# Patient Record
Sex: Female | Born: 1960 | Race: Black or African American | Hispanic: No | Marital: Married | Smoking: Former smoker
Health system: Southern US, Community
[De-identification: ages and names within clinical notes are randomized; demographics above are authoritative.]

## PROBLEM LIST (undated history)

## (undated) DIAGNOSIS — F4310 Post-traumatic stress disorder, unspecified: Secondary | ICD-10-CM

## (undated) DIAGNOSIS — F329 Major depressive disorder, single episode, unspecified: Secondary | ICD-10-CM

## (undated) DIAGNOSIS — IMO0001 Reserved for inherently not codable concepts without codable children: Secondary | ICD-10-CM

## (undated) DIAGNOSIS — K219 Gastro-esophageal reflux disease without esophagitis: Secondary | ICD-10-CM

## (undated) DIAGNOSIS — R7989 Other specified abnormal findings of blood chemistry: Secondary | ICD-10-CM

## (undated) DIAGNOSIS — E119 Type 2 diabetes mellitus without complications: Secondary | ICD-10-CM

## (undated) DIAGNOSIS — D649 Anemia, unspecified: Secondary | ICD-10-CM

## (undated) DIAGNOSIS — E785 Hyperlipidemia, unspecified: Secondary | ICD-10-CM

## (undated) DIAGNOSIS — F32A Depression, unspecified: Secondary | ICD-10-CM

## (undated) DIAGNOSIS — R413 Other amnesia: Secondary | ICD-10-CM

## (undated) DIAGNOSIS — R519 Headache, unspecified: Secondary | ICD-10-CM

## (undated) DIAGNOSIS — Z8719 Personal history of other diseases of the digestive system: Secondary | ICD-10-CM

## (undated) HISTORY — PX: COLONOSCOPY WITH ESOPHAGOGASTRODUODENOSCOPY (EGD): SHX5779

## (undated) HISTORY — DX: Type 2 diabetes mellitus without complications: E11.9

## (undated) HISTORY — DX: Other amnesia: R41.3

## (undated) HISTORY — DX: Gastro-esophageal reflux disease without esophagitis: K21.9

## (undated) HISTORY — DX: Anemia, unspecified: D64.9

## (undated) HISTORY — DX: Other specified abnormal findings of blood chemistry: R79.89

## (undated) HISTORY — DX: Major depressive disorder, single episode, unspecified: F32.9

## (undated) HISTORY — DX: Reserved for inherently not codable concepts without codable children: IMO0001

## (undated) HISTORY — DX: Depression, unspecified: F32.A

## (undated) HISTORY — DX: Headache, unspecified: R51.9

---

## 2004-01-04 ENCOUNTER — Other Ambulatory Visit: Admission: RE | Admit: 2004-01-04 | Discharge: 2004-01-04 | Payer: Self-pay | Admitting: Family Medicine

## 2004-02-21 ENCOUNTER — Encounter: Admission: RE | Admit: 2004-02-21 | Discharge: 2004-02-21 | Payer: Self-pay | Admitting: Family Medicine

## 2010-03-15 ENCOUNTER — Other Ambulatory Visit: Admission: RE | Admit: 2010-03-15 | Discharge: 2010-03-15 | Payer: Self-pay | Admitting: Family Medicine

## 2010-03-26 ENCOUNTER — Encounter: Admission: RE | Admit: 2010-03-26 | Discharge: 2010-03-26 | Payer: Self-pay | Admitting: Family Medicine

## 2012-01-27 ENCOUNTER — Other Ambulatory Visit: Payer: Self-pay | Admitting: Family Medicine

## 2012-01-27 DIAGNOSIS — Z1231 Encounter for screening mammogram for malignant neoplasm of breast: Secondary | ICD-10-CM

## 2012-02-12 ENCOUNTER — Other Ambulatory Visit: Payer: Self-pay | Admitting: Family Medicine

## 2012-02-12 ENCOUNTER — Ambulatory Visit
Admission: RE | Admit: 2012-02-12 | Discharge: 2012-02-12 | Disposition: A | Discharge status: Home / Self Care | Payer: BC Managed Care – PPO | Source: Ambulatory Visit | Attending: Family Medicine | Admitting: Family Medicine

## 2012-02-12 DIAGNOSIS — Z1231 Encounter for screening mammogram for malignant neoplasm of breast: Secondary | ICD-10-CM

## 2012-02-12 DIAGNOSIS — N63 Unspecified lump in unspecified breast: Secondary | ICD-10-CM

## 2012-02-21 ENCOUNTER — Other Ambulatory Visit: Payer: Self-pay | Admitting: Family Medicine

## 2012-02-21 DIAGNOSIS — Z1239 Encounter for other screening for malignant neoplasm of breast: Secondary | ICD-10-CM

## 2012-03-12 ENCOUNTER — Ambulatory Visit
Admission: RE | Admit: 2012-03-12 | Discharge: 2012-03-12 | Disposition: A | Discharge status: Home / Self Care | Payer: BC Managed Care – PPO | Source: Ambulatory Visit | Attending: Family Medicine | Admitting: Family Medicine

## 2012-03-12 DIAGNOSIS — Z1239 Encounter for other screening for malignant neoplasm of breast: Secondary | ICD-10-CM

## 2013-07-06 ENCOUNTER — Other Ambulatory Visit: Payer: Self-pay

## 2013-07-06 DIAGNOSIS — Z1231 Encounter for screening mammogram for malignant neoplasm of breast: Secondary | ICD-10-CM

## 2013-07-28 ENCOUNTER — Ambulatory Visit
Admission: RE | Admit: 2013-07-28 | Discharge: 2013-07-28 | Disposition: A | Discharge status: Home / Self Care | Payer: BC Managed Care – PPO | Source: Ambulatory Visit

## 2013-07-28 DIAGNOSIS — Z1231 Encounter for screening mammogram for malignant neoplasm of breast: Secondary | ICD-10-CM

## 2013-08-02 ENCOUNTER — Other Ambulatory Visit (HOSPITAL_COMMUNITY)
Admission: RE | Admit: 2013-08-02 | Discharge: 2013-08-02 | Disposition: A | Discharge status: Home / Self Care | Payer: BC Managed Care – PPO | Source: Ambulatory Visit | Attending: Family Medicine | Admitting: Family Medicine

## 2013-08-02 ENCOUNTER — Other Ambulatory Visit: Payer: Self-pay | Admitting: Family Medicine

## 2013-08-02 DIAGNOSIS — Z124 Encounter for screening for malignant neoplasm of cervix: Secondary | ICD-10-CM | POA: Insufficient documentation

## 2014-11-16 ENCOUNTER — Encounter: Payer: Self-pay | Admitting: *Deleted

## 2014-11-17 ENCOUNTER — Ambulatory Visit (INDEPENDENT_AMBULATORY_CARE_PROVIDER_SITE_OTHER): Payer: BC Managed Care – PPO | Admitting: Neurology

## 2014-11-17 ENCOUNTER — Encounter: Payer: Self-pay | Admitting: Neurology

## 2014-11-17 VITALS — BP 124/84 | HR 79 | Ht 66.0 in | Wt 150.3 lb

## 2014-11-17 DIAGNOSIS — R202 Paresthesia of skin: Secondary | ICD-10-CM

## 2014-11-17 DIAGNOSIS — R29898 Other symptoms and signs involving the musculoskeletal system: Secondary | ICD-10-CM

## 2014-11-17 DIAGNOSIS — R29818 Other symptoms and signs involving the nervous system: Secondary | ICD-10-CM

## 2014-11-17 LAB — VITAMIN B12: Vitamin B-12: 283 pg/mL (ref 211–911)

## 2014-11-17 NOTE — Progress Notes (Signed)
[facility] Clinic Note - Initial Visit   Date: 11/17/2014  Shannon Estes MRN: [id number] DOB: 05/02/1961   Dear Dr. Laurann Montana:  Thank you for your kind referral of Shannon Estes for consultation of right sided paresthesias. Although her history is well known to you, please allow [place] to reiterate it for the purpose of our medical record. The patient was accompanied to the clinic by self.    History of Present Illness: Shannon Estes is a 53 y.o. left-handed female with depression, anemia, and GERD presenting for evaluation of right arm and leg paresthesias.    Since the summer of 2015, she reports having intermittent numbness/tingling of entire upper extremity (proximally and distally) which usually occurs at nighttime or in the morning. It occurs about 4-5 times per week and lasts about 10-minutes.  She tries rubbing it which makes it feel better.  There is no weakness of the hands.  In early October, she was getting her hair done and when she stood up, developed acute weakness of her right leg.  She felt as if there was "no leg" and fell on the lady doing her hair, but did not have tingling sensation.  She was unable to feel her leg or move it at all.  This lasted about 10-minutes.  There was no associated face or arm weakness/numbness or loss of consciousness.  She twisted her ankle and developed leg swelling later that night.  Since then, she denies any weakness and has been doing well, but was understandable very concerned and tearful when it occurred.  Prior to that, she always felt unsteady especially when in the shower.  She also complaints of intermittent right leg pain which improves with using a heating pad.  Out-side paper records, electronic medical record, and images have been reviewed where available and summarized as:  Labs 09/29/2014:  TSH 0.95, Chol 217, DLD 81, LDL 120  Past Medical History  Diagnosis Date  . Anemia   . Reflux   .  Depression     Past Surgical History  Procedure Laterality Date  . Cesarean section      2 times  . Colonoscopy with esophagogastroduodenoscopy (egd)       Medications:  Current Outpatient Prescriptions on File Prior to Visit  Medication Sig Dispense Refill  . ferrous sulfate 325 (65 FE) MG tablet Take 325 mg by mouth daily with breakfast.    . pantoprazole (PROTONIX) 40 MG tablet Take 40 mg by mouth daily.     No current facility-administered medications on file prior to visit.    Allergies:  Allergies  Allergen Reactions  . Latex     Family History: Family History  Problem Relation Age of Onset  . Arthritis Father     Living, 23  . Colon polyps Father   . Kidney disease Mother     Living, 21  . Diabetes Mother   . Breast cancer Mother   . Other Brother     Agent orange complications, deceased 70  . Healthy Son   . Migraines Daughter     Social History: History   Social History  . Marital Status: Single    Spouse Name: N/A    Number of Children: N/A  . Years of Education: N/A   Occupational History  . Not on file.   Social History Main Topics  . Smoking status: Former Smoker    Quit date: 12/16/1981  . Smokeless tobacco: Not on file  . Alcohol  Use: 0.0 oz/week    0 Not specified per week     Comment: 2 drinks about 2-3 times per week  . Drug Use: No  . Sexual Activity: Not on file   Other Topics Concern  . Not on file   Social History Narrative   Married with 1 son and 1 daughter.  Lives in a tri-level home.     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     Education: high school and 4 years of [employer] [phone])    Review of Systems:  CONSTITUTIONAL: No fevers, chills, night sweats, or weight loss.   EYES: No visual changes or eye pain ENT: No hearing changes.  No history of nose bleeds.   RESPIRATORY: No cough, wheezing and shortness of breath.   CARDIOVASCULAR: Negative for chest pain, and palpitations.    GI: Negative for abdominal discomfort, blood in stools or black stools.  No recent change in bowel habits.   GU:  No history of incontinence.   MUSCLOSKELETAL: No history of joint pain or swelling.  No myalgias.   SKIN: Negative for lesions, rash, and itching.   HEMATOLOGY/ONCOLOGY: Negative for prolonged bleeding, bruising easily, and swollen nodes.     ENDOCRINE: Negative for cold or heat intolerance, polydipsia or goiter.   PSYCH:  No depression or anxiety symptoms.   NEURO: As Above.   Vital Signs:  BP 124/84 mmHg  Pulse 79  Ht 5' 6" (1.676 m)  Wt 150 lb 5 oz (68.181 kg)  BMI 24.27 kg/m2  SpO2 97%  General Medical Exam:   General:  Well appearing, comfortable.   Eyes/ENT: see cranial nerve examination.   Neck: No masses appreciated.  Full range of motion without tenderness.  No carotid bruits.  Lhermitte's negative. Respiratory:  Clear to auscultation, good air entry bilaterally.   Cardiac:  Regular rate and rhythm, no murmur.   Extremities:  No deformities, edema, or skin discoloration.  Skin:  No rashes or lesions.  Neurological Exam: MENTAL STATUS including orientation to time, place, person, recent and remote memory, attention span and concentration, language, and fund of knowledge is normal.  Speech is not dysarthric.  CRANIAL NERVES: II:  No visual field defects.  Unremarkable fundi.   III-IV-VI: Pupils equal round and reactive to light.  Normal conjugate, extra-ocular eye movements in all directions of gaze.  No nystagmus.  No ptosis.   V:  Normal facial sensation.     VII:  Normal facial symmetry and movements.  No pathologic facial reflexes.  VIII:  Normal hearing and vestibular function.   IX-X:  Normal palatal movement.   XI:  Normal shoulder shrug and head rotation.   XII:  Normal tongue strength and range of motion, no deviation or fasciculation.  MOTOR:  No atrophy, fasciculations or abnormal movements.  No pronator drift.  Tone is normal.    Right Upper  Extremity:    Left Upper Extremity:    Deltoid  5/5   Deltoid  5/5   Biceps  5/5   Biceps  5/5   Triceps  5/5   Triceps  5/5   Wrist extensors  5/5   Wrist extensors  5/5   Wrist flexors  5/5   Wrist flexors  5/5   Finger extensors  5/5   Finger extensors  5/5   Finger flexors  5/5   Finger flexors  5/5   Dorsal interossei  5/5   Dorsal interossei  5/5  Abductor pollicis  5/5   Abductor pollicis  5/5   Tone (Ashworth scale)  0  Tone (Ashworth scale)  0   Right Lower Extremity:    Left Lower Extremity:    Hip flexors  5/5   Hip flexors  5/5   Hip extensors  5/5   Hip extensors  5/5   Knee flexors  5/5   Knee flexors  5/5   Knee extensors  5/5   Knee extensors  5/5   Dorsiflexors  5/5   Dorsiflexors  5/5   Plantarflexors  5/5   Plantarflexors  5/5   Toe extensors  5/5   Toe extensors  5/5   Toe flexors  5/5   Toe flexors  5/5   Tone (Ashworth scale)  0  Tone (Ashworth scale)  0   MSRs:  Right                                                                 Left brachioradialis 2+  brachioradialis 2+  biceps 2+  biceps 2+  triceps 2+  triceps 2+  patellar 3+  patellar 2+  ankle jerk 2+  ankle jerk 2+  Hoffman no  Hoffman no  plantar response down  plantar response down  Tinel's negative at the wrist bilaterally.  SENSORY:  Normal and symmetric perception of light touch, pinprick, vibration, and proprioception.  Romberg's sign absent.   COORDINATION/GAIT: Normal finger-to- nose-finger and heel-to-shin.  Intact rapid alternating movements bilaterally.  Able to rise from a chair without using arms.  Gait narrow based and stable. Tandem and stressed gait intact.    IMPRESSION: Mrs. Baldridge is a 53 year-old female presenting for evaluation of episodic upper extremity paresthesias and right leg weakness (09/2014).  Her neurological exam is relatively normal, with only subtle increase in patella jerk on the right.  Motor strength and sensation is preserved.  Based on her history of  transient right leg monoplegia, vascular event such as TIA needs to be evaluated for.  I will order MRI/A brain and MRA carotids.  Demyelinating disease is another possibility, especially with the episodic nature of her symptoms.  Her upper extremity symptoms are suggestive of nerve entrapment (CTS vs ulnar neuropathy) but it is atypical for the entire upper extremity to be involved.  I'll see what her imaging shows and determine if EMG vs MRI c-spine is the next step.    PLAN/RECOMMENDATIONS:  1.  Check ESR, 2hr glucose tolerance test, vitamin B12, copper 2.  MRI brain and MRA brain and carotids 3.  If above testing is negative, will proceed with EMG of the right arm and leg 4.  For secondary risk prevention, start aspirin 8m daily 5.  Return to clinic in 268-monthor sooner as needed   The duration of this appointment visit was 45 minutes of face-to-face time with the patient.  Greater than 50% of this time was spent in counseling, explanation of diagnosis, planning of further management, and coordination of care.   Thank you for allowing me to participate in patient's care.  If I can answer any additional questions, I would be pleased to do so.    Sincerely,    Donika K. PaPosey ProntoDO

## 2014-11-17 NOTE — Progress Notes (Signed)
Note faxed.

## 2014-11-17 NOTE — Patient Instructions (Addendum)
1.  Check ESR, 2hr glucose tolerance test, vitamin B12, copper 2.  MRI brain and MRA brain and carotids 3.  Start taking aspirin 17m daily 4.  If above testing is negative and symptoms continue, will proceed with EMG of the right arm and leg 5.  Return to clinic in 241-month

## 2014-11-18 LAB — SEDIMENTATION RATE: Sed Rate: 15 mm/h (ref 0–22)

## 2014-11-19 LAB — COPPER, SERUM: Copper: 103 ug/dL (ref 70–175)

## 2014-11-21 ENCOUNTER — Telehealth: Payer: Self-pay | Admitting: Neurology

## 2014-11-21 NOTE — Telephone Encounter (Signed)
Pt returning your call [phone]

## 2014-11-23 ENCOUNTER — Other Ambulatory Visit: Payer: BC Managed Care – PPO

## 2014-11-23 DIAGNOSIS — R202 Paresthesia of skin: Secondary | ICD-10-CM

## 2014-11-23 DIAGNOSIS — R29818 Other symptoms and signs involving the nervous system: Secondary | ICD-10-CM

## 2014-11-23 DIAGNOSIS — R29898 Other symptoms and signs involving the musculoskeletal system: Secondary | ICD-10-CM

## 2014-11-23 LAB — GLUCOSE TOLERANCE, 2 HOURS
Glucose, 1 Hour GTT: 144 mg/dL
Glucose, 2 hour: 114 mg/dL
Glucose, Fasting: 101 mg/dL — ABNORMAL HIGH (ref 70–99)

## 2014-11-27 ENCOUNTER — Ambulatory Visit
Admission: RE | Admit: 2014-11-27 | Discharge: 2014-11-27 | Disposition: A | Discharge status: Home / Self Care | Payer: BC Managed Care – PPO | Source: Ambulatory Visit | Attending: Neurology | Admitting: Neurology

## 2014-11-27 DIAGNOSIS — R29898 Other symptoms and signs involving the musculoskeletal system: Secondary | ICD-10-CM

## 2014-11-27 DIAGNOSIS — R29818 Other symptoms and signs involving the nervous system: Secondary | ICD-10-CM

## 2014-11-27 DIAGNOSIS — R202 Paresthesia of skin: Secondary | ICD-10-CM

## 2014-11-27 MED ORDER — GADOBENATE DIMEGLUMINE 529 MG/ML IV SOLN
14.0000 mL | Freq: Once | INTRAVENOUS | Status: AC | PRN
  Administered 2014-11-27: 14 mL via INTRAVENOUS

## 2014-12-14 ENCOUNTER — Other Ambulatory Visit: Payer: Self-pay | Admitting: *Deleted

## 2014-12-14 DIAGNOSIS — R202 Paresthesia of skin: Secondary | ICD-10-CM

## 2015-01-12 ENCOUNTER — Telehealth: Payer: Self-pay | Admitting: Neurology

## 2015-01-12 NOTE — Telephone Encounter (Signed)
Pt canceled her 90mins EMG. Pt stated that she is feeling better since she stated eating better and doing the B-12. Pt will still come in on 01/26/15 for her f/u.

## 2015-01-16 ENCOUNTER — Encounter: Payer: BC Managed Care – PPO | Admitting: Neurology

## 2015-01-26 ENCOUNTER — Ambulatory Visit: Payer: BC Managed Care – PPO | Admitting: Neurology

## 2015-02-01 ENCOUNTER — Telehealth: Payer: Self-pay | Admitting: Neurology

## 2015-02-01 NOTE — Telephone Encounter (Signed)
Pt canceled appt to see Dr Allena KatzPatel 01-26-15

## 2016-10-16 ENCOUNTER — Other Ambulatory Visit: Payer: Self-pay | Admitting: Family Medicine

## 2016-10-16 ENCOUNTER — Other Ambulatory Visit (HOSPITAL_COMMUNITY)
Admission: RE | Admit: 2016-10-16 | Discharge: 2016-10-16 | Disposition: A | Discharge status: Home / Self Care | Payer: BLUE CROSS/BLUE SHIELD | Source: Ambulatory Visit | Attending: Family Medicine | Admitting: Family Medicine

## 2016-10-16 DIAGNOSIS — Z124 Encounter for screening for malignant neoplasm of cervix: Secondary | ICD-10-CM | POA: Insufficient documentation

## 2016-10-18 LAB — CYTOLOGY - PAP: DIAGNOSIS: NEGATIVE

## 2018-03-17 DIAGNOSIS — K219 Gastro-esophageal reflux disease without esophagitis: Secondary | ICD-10-CM | POA: Diagnosis not present

## 2018-03-17 DIAGNOSIS — R12 Heartburn: Secondary | ICD-10-CM | POA: Diagnosis not present

## 2018-06-05 ENCOUNTER — Emergency Department (HOSPITAL_COMMUNITY): Payer: Commercial Managed Care - PPO

## 2018-06-05 ENCOUNTER — Encounter (HOSPITAL_COMMUNITY): Payer: Self-pay

## 2018-06-05 ENCOUNTER — Emergency Department (HOSPITAL_COMMUNITY)
Admission: EM | Admit: 2018-06-05 | Discharge: 2018-06-05 | Disposition: A | Discharge status: Home / Self Care | Payer: Commercial Managed Care - PPO | Attending: Emergency Medicine | Admitting: Emergency Medicine

## 2018-06-05 ENCOUNTER — Other Ambulatory Visit: Payer: Self-pay

## 2018-06-05 DIAGNOSIS — Y939 Activity, unspecified: Secondary | ICD-10-CM | POA: Diagnosis not present

## 2018-06-05 DIAGNOSIS — T07XXXA Unspecified multiple injuries, initial encounter: Secondary | ICD-10-CM | POA: Diagnosis not present

## 2018-06-05 DIAGNOSIS — M25522 Pain in left elbow: Secondary | ICD-10-CM | POA: Diagnosis not present

## 2018-06-05 DIAGNOSIS — Z23 Encounter for immunization: Secondary | ICD-10-CM | POA: Insufficient documentation

## 2018-06-05 DIAGNOSIS — Z87891 Personal history of nicotine dependence: Secondary | ICD-10-CM | POA: Diagnosis not present

## 2018-06-05 DIAGNOSIS — R0902 Hypoxemia: Secondary | ICD-10-CM | POA: Diagnosis not present

## 2018-06-05 DIAGNOSIS — S3993XA Unspecified injury of pelvis, initial encounter: Secondary | ICD-10-CM | POA: Diagnosis not present

## 2018-06-05 DIAGNOSIS — Z79899 Other long term (current) drug therapy: Secondary | ICD-10-CM | POA: Diagnosis not present

## 2018-06-05 DIAGNOSIS — M542 Cervicalgia: Secondary | ICD-10-CM

## 2018-06-05 DIAGNOSIS — S199XXA Unspecified injury of neck, initial encounter: Secondary | ICD-10-CM | POA: Diagnosis not present

## 2018-06-05 DIAGNOSIS — R0789 Other chest pain: Secondary | ICD-10-CM

## 2018-06-05 DIAGNOSIS — Y92410 Unspecified street and highway as the place of occurrence of the external cause: Secondary | ICD-10-CM | POA: Diagnosis not present

## 2018-06-05 DIAGNOSIS — R1012 Left upper quadrant pain: Secondary | ICD-10-CM | POA: Insufficient documentation

## 2018-06-05 DIAGNOSIS — Y999 Unspecified external cause status: Secondary | ICD-10-CM | POA: Diagnosis not present

## 2018-06-05 DIAGNOSIS — S59902A Unspecified injury of left elbow, initial encounter: Secondary | ICD-10-CM | POA: Diagnosis not present

## 2018-06-05 DIAGNOSIS — S3991XA Unspecified injury of abdomen, initial encounter: Secondary | ICD-10-CM | POA: Diagnosis not present

## 2018-06-05 DIAGNOSIS — S299XXA Unspecified injury of thorax, initial encounter: Secondary | ICD-10-CM | POA: Diagnosis not present

## 2018-06-05 DIAGNOSIS — S0990XA Unspecified injury of head, initial encounter: Secondary | ICD-10-CM | POA: Diagnosis not present

## 2018-06-05 LAB — CBC WITH DIFFERENTIAL/PLATELET
ABS IMMATURE GRANULOCYTES: 0 10*3/uL (ref 0.0–0.1)
Basophils Absolute: 0 10*3/uL (ref 0.0–0.1)
Basophils Relative: 1 %
EOS ABS: 0.1 10*3/uL (ref 0.0–0.7)
Eosinophils Relative: 2 %
HEMATOCRIT: 35.4 % — AB (ref 36.0–46.0)
HEMOGLOBIN: 11 g/dL — AB (ref 12.0–15.0)
IMMATURE GRANULOCYTES: 0 %
LYMPHS ABS: 2 10*3/uL (ref 0.7–4.0)
LYMPHS PCT: 40 %
MCH: 29.6 pg (ref 26.0–34.0)
MCHC: 31.1 g/dL (ref 30.0–36.0)
MCV: 95.2 fL (ref 78.0–100.0)
Monocytes Absolute: 0.4 10*3/uL (ref 0.1–1.0)
Monocytes Relative: 9 %
NEUTROS ABS: 2.5 10*3/uL (ref 1.7–7.7)
NEUTROS PCT: 48 %
Platelets: 354 10*3/uL (ref 150–400)
RBC: 3.72 MIL/uL — AB (ref 3.87–5.11)
RDW: 12.1 % (ref 11.5–15.5)
WBC: 5 10*3/uL (ref 4.0–10.5)

## 2018-06-05 LAB — COMPREHENSIVE METABOLIC PANEL
ALBUMIN: 3.9 g/dL (ref 3.5–5.0)
ALK PHOS: 80 U/L (ref 38–126)
ALT: 16 U/L (ref 14–54)
AST: 22 U/L (ref 15–41)
Anion gap: 9 (ref 5–15)
BILIRUBIN TOTAL: 0.6 mg/dL (ref 0.3–1.2)
BUN: 11 mg/dL (ref 6–20)
CO2: 24 mmol/L (ref 22–32)
CREATININE: 0.91 mg/dL (ref 0.44–1.00)
Calcium: 9.3 mg/dL (ref 8.9–10.3)
Chloride: 107 mmol/L (ref 101–111)
GFR calc Af Amer: >60 mL/min (ref >60)
Glucose, Bld: 90 mg/dL (ref 65–99)
POTASSIUM: 3.5 mmol/L (ref 3.5–5.1)
Sodium: 140 mmol/L (ref 135–145)
TOTAL PROTEIN: 7.4 g/dL (ref 6.5–8.1)

## 2018-06-05 LAB — URINALYSIS, ROUTINE W REFLEX MICROSCOPIC
BACTERIA UA: NONE SEEN
BILIRUBIN URINE: NEGATIVE
Glucose, UA: NEGATIVE mg/dL
Ketones, ur: 20 mg/dL — AB
Leukocytes, UA: NEGATIVE
NITRITE: NEGATIVE
Protein, ur: NEGATIVE mg/dL
SPECIFIC GRAVITY, URINE: 1.043 — AB (ref 1.005–1.030)
pH: 6 (ref 5.0–8.0)

## 2018-06-05 LAB — LIPASE, BLOOD: LIPASE: 39 U/L (ref 11–51)

## 2018-06-05 MED ORDER — BACITRACIN ZINC 500 UNIT/GM EX OINT
TOPICAL_OINTMENT | Freq: Two times a day (BID) | CUTANEOUS | Status: DC
  Administered 2018-06-05: 1 via TOPICAL

## 2018-06-05 MED ORDER — FENTANYL CITRATE (PF) 100 MCG/2ML IJ SOLN
25.0000 ug | Freq: Once | INTRAMUSCULAR | Status: AC
  Administered 2018-06-05: 25 ug via INTRAVENOUS
  Filled 2018-06-05: qty 2

## 2018-06-05 MED ORDER — IOHEXOL 300 MG/ML  SOLN
100.0000 mL | Freq: Once | INTRAMUSCULAR | Status: AC | PRN
  Administered 2018-06-05: 100 mL via INTRAVENOUS

## 2018-06-05 MED ORDER — TETANUS-DIPHTH-ACELL PERTUSSIS 5-2.5-18.5 LF-MCG/0.5 IM SUSP
0.5000 mL | Freq: Once | INTRAMUSCULAR | Status: AC
  Administered 2018-06-05: 0.5 mL via INTRAMUSCULAR
  Filled 2018-06-05: qty 0.5

## 2018-06-05 MED ORDER — IBUPROFEN 600 MG PO TABS: 600.0000 mg | ORAL_TABLET | Freq: Four times a day (QID) | ORAL | 0 refills | Status: DC | PRN

## 2018-06-05 MED ORDER — METHOCARBAMOL 500 MG PO TABS: 500.0000 mg | ORAL_TABLET | Freq: Two times a day (BID) | ORAL | 0 refills | Status: DC

## 2018-06-05 NOTE — Discharge Instructions (Signed)
All your imaging looks good.  The pain your experiencing is likely due to muscle strain, you may take Ibuprofen and Robaxin as needed for pain management. Do not combine with any pain reliever other than tylenol. The muscle soreness should improve over the next week. Follow up with your family doctor in the next week for a recheck if you are still having symptoms. Return to ED if pain is worsening, you develop weakness or numbness of extremities, or new or concerning symptoms develop.
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
## 2018-06-05 NOTE — ED Notes (Signed)
Please call RN before sending visitors to room

## 2018-06-08 DIAGNOSIS — M542 Cervicalgia: Secondary | ICD-10-CM | POA: Diagnosis not present

## 2018-06-08 DIAGNOSIS — S61219A Laceration without foreign body of unspecified finger without damage to nail, initial encounter: Secondary | ICD-10-CM | POA: Diagnosis not present

## 2018-06-12 DIAGNOSIS — M545 Low back pain: Secondary | ICD-10-CM | POA: Diagnosis not present

## 2018-06-12 DIAGNOSIS — M542 Cervicalgia: Secondary | ICD-10-CM | POA: Diagnosis not present

## 2018-06-12 DIAGNOSIS — S060X0A Concussion without loss of consciousness, initial encounter: Secondary | ICD-10-CM | POA: Diagnosis not present

## 2018-06-15 DIAGNOSIS — M545 Low back pain: Secondary | ICD-10-CM | POA: Diagnosis not present

## 2018-06-19 DIAGNOSIS — M542 Cervicalgia: Secondary | ICD-10-CM | POA: Diagnosis not present

## 2018-07-01 DIAGNOSIS — M545 Low back pain: Secondary | ICD-10-CM | POA: Diagnosis not present

## 2018-07-01 DIAGNOSIS — M542 Cervicalgia: Secondary | ICD-10-CM | POA: Diagnosis not present

## 2018-07-01 DIAGNOSIS — R42 Dizziness and giddiness: Secondary | ICD-10-CM | POA: Diagnosis not present

## 2018-07-13 DIAGNOSIS — M542 Cervicalgia: Secondary | ICD-10-CM | POA: Diagnosis not present

## 2018-07-13 DIAGNOSIS — M545 Low back pain: Secondary | ICD-10-CM | POA: Diagnosis not present

## 2018-07-13 DIAGNOSIS — R42 Dizziness and giddiness: Secondary | ICD-10-CM | POA: Diagnosis not present

## 2018-07-17 DIAGNOSIS — M545 Low back pain: Secondary | ICD-10-CM | POA: Diagnosis not present

## 2018-07-17 DIAGNOSIS — M542 Cervicalgia: Secondary | ICD-10-CM | POA: Diagnosis not present

## 2018-07-17 DIAGNOSIS — R42 Dizziness and giddiness: Secondary | ICD-10-CM | POA: Diagnosis not present

## 2018-07-22 DIAGNOSIS — M545 Low back pain: Secondary | ICD-10-CM | POA: Diagnosis not present

## 2018-07-22 DIAGNOSIS — R42 Dizziness and giddiness: Secondary | ICD-10-CM | POA: Diagnosis not present

## 2018-07-22 DIAGNOSIS — M542 Cervicalgia: Secondary | ICD-10-CM | POA: Diagnosis not present

## 2018-07-24 DIAGNOSIS — S060X0A Concussion without loss of consciousness, initial encounter: Secondary | ICD-10-CM | POA: Diagnosis not present

## 2018-07-24 DIAGNOSIS — M545 Low back pain: Secondary | ICD-10-CM | POA: Diagnosis not present

## 2018-07-24 DIAGNOSIS — M542 Cervicalgia: Secondary | ICD-10-CM | POA: Diagnosis not present

## 2018-07-29 DIAGNOSIS — M542 Cervicalgia: Secondary | ICD-10-CM | POA: Diagnosis not present

## 2018-07-29 DIAGNOSIS — R42 Dizziness and giddiness: Secondary | ICD-10-CM | POA: Diagnosis not present

## 2018-07-29 DIAGNOSIS — M545 Low back pain: Secondary | ICD-10-CM | POA: Diagnosis not present

## 2018-08-03 DIAGNOSIS — M545 Low back pain: Secondary | ICD-10-CM | POA: Diagnosis not present

## 2018-08-03 DIAGNOSIS — M542 Cervicalgia: Secondary | ICD-10-CM | POA: Diagnosis not present

## 2018-08-03 DIAGNOSIS — R42 Dizziness and giddiness: Secondary | ICD-10-CM | POA: Diagnosis not present

## 2018-08-06 DIAGNOSIS — M545 Low back pain: Secondary | ICD-10-CM | POA: Diagnosis not present

## 2018-08-06 DIAGNOSIS — M542 Cervicalgia: Secondary | ICD-10-CM | POA: Diagnosis not present

## 2018-08-06 DIAGNOSIS — R42 Dizziness and giddiness: Secondary | ICD-10-CM | POA: Diagnosis not present

## 2018-08-10 DIAGNOSIS — M542 Cervicalgia: Secondary | ICD-10-CM | POA: Diagnosis not present

## 2018-08-10 DIAGNOSIS — M545 Low back pain: Secondary | ICD-10-CM | POA: Diagnosis not present

## 2018-08-10 DIAGNOSIS — R42 Dizziness and giddiness: Secondary | ICD-10-CM | POA: Diagnosis not present

## 2018-08-13 DIAGNOSIS — M545 Low back pain: Secondary | ICD-10-CM | POA: Diagnosis not present

## 2018-08-13 DIAGNOSIS — M542 Cervicalgia: Secondary | ICD-10-CM | POA: Diagnosis not present

## 2018-08-13 DIAGNOSIS — R42 Dizziness and giddiness: Secondary | ICD-10-CM | POA: Diagnosis not present

## 2018-08-20 DIAGNOSIS — M542 Cervicalgia: Secondary | ICD-10-CM | POA: Diagnosis not present

## 2018-08-20 DIAGNOSIS — R42 Dizziness and giddiness: Secondary | ICD-10-CM | POA: Diagnosis not present

## 2018-08-20 DIAGNOSIS — M545 Low back pain: Secondary | ICD-10-CM | POA: Diagnosis not present

## 2018-08-21 DIAGNOSIS — M542 Cervicalgia: Secondary | ICD-10-CM | POA: Diagnosis not present

## 2018-08-21 DIAGNOSIS — M545 Low back pain: Secondary | ICD-10-CM | POA: Diagnosis not present

## 2018-08-21 DIAGNOSIS — S060X0A Concussion without loss of consciousness, initial encounter: Secondary | ICD-10-CM | POA: Diagnosis not present

## 2018-08-25 DIAGNOSIS — M542 Cervicalgia: Secondary | ICD-10-CM | POA: Diagnosis not present

## 2018-08-25 DIAGNOSIS — M545 Low back pain: Secondary | ICD-10-CM | POA: Diagnosis not present

## 2018-08-25 DIAGNOSIS — R42 Dizziness and giddiness: Secondary | ICD-10-CM | POA: Diagnosis not present

## 2018-08-27 DIAGNOSIS — R42 Dizziness and giddiness: Secondary | ICD-10-CM | POA: Diagnosis not present

## 2018-08-27 DIAGNOSIS — M542 Cervicalgia: Secondary | ICD-10-CM | POA: Diagnosis not present

## 2018-08-27 DIAGNOSIS — M545 Low back pain: Secondary | ICD-10-CM | POA: Diagnosis not present

## 2018-09-03 DIAGNOSIS — M542 Cervicalgia: Secondary | ICD-10-CM | POA: Diagnosis not present

## 2018-09-03 DIAGNOSIS — M545 Low back pain: Secondary | ICD-10-CM | POA: Diagnosis not present

## 2018-09-03 DIAGNOSIS — R42 Dizziness and giddiness: Secondary | ICD-10-CM | POA: Diagnosis not present

## 2018-09-10 DIAGNOSIS — R42 Dizziness and giddiness: Secondary | ICD-10-CM | POA: Diagnosis not present

## 2018-09-10 DIAGNOSIS — M545 Low back pain: Secondary | ICD-10-CM | POA: Diagnosis not present

## 2018-09-10 DIAGNOSIS — M542 Cervicalgia: Secondary | ICD-10-CM | POA: Diagnosis not present

## 2018-10-16 DIAGNOSIS — M545 Low back pain: Secondary | ICD-10-CM | POA: Diagnosis not present

## 2018-10-16 DIAGNOSIS — S060X0A Concussion without loss of consciousness, initial encounter: Secondary | ICD-10-CM | POA: Diagnosis not present

## 2018-10-16 DIAGNOSIS — M542 Cervicalgia: Secondary | ICD-10-CM | POA: Diagnosis not present

## 2018-10-28 ENCOUNTER — Other Ambulatory Visit: Payer: Self-pay | Admitting: Family Medicine

## 2018-10-28 DIAGNOSIS — Z1231 Encounter for screening mammogram for malignant neoplasm of breast: Secondary | ICD-10-CM

## 2018-10-30 ENCOUNTER — Ambulatory Visit
Admission: RE | Admit: 2018-10-30 | Discharge: 2018-10-30 | Disposition: A | Discharge status: Home / Self Care | Payer: Commercial Managed Care - PPO | Source: Ambulatory Visit | Attending: Family Medicine | Admitting: Family Medicine

## 2018-10-30 DIAGNOSIS — Z1231 Encounter for screening mammogram for malignant neoplasm of breast: Secondary | ICD-10-CM | POA: Diagnosis not present

## 2018-11-06 DIAGNOSIS — K219 Gastro-esophageal reflux disease without esophagitis: Secondary | ICD-10-CM | POA: Diagnosis not present

## 2018-11-06 DIAGNOSIS — R7303 Prediabetes: Secondary | ICD-10-CM | POA: Diagnosis not present

## 2018-11-06 DIAGNOSIS — R413 Other amnesia: Secondary | ICD-10-CM | POA: Diagnosis not present

## 2018-11-06 DIAGNOSIS — M542 Cervicalgia: Secondary | ICD-10-CM | POA: Diagnosis not present

## 2018-11-06 DIAGNOSIS — Z0000 Encounter for general adult medical examination without abnormal findings: Secondary | ICD-10-CM | POA: Diagnosis not present

## 2018-11-06 DIAGNOSIS — D509 Iron deficiency anemia, unspecified: Secondary | ICD-10-CM | POA: Diagnosis not present

## 2018-11-06 DIAGNOSIS — Z136 Encounter for screening for cardiovascular disorders: Secondary | ICD-10-CM | POA: Diagnosis not present

## 2018-11-06 DIAGNOSIS — Z1159 Encounter for screening for other viral diseases: Secondary | ICD-10-CM | POA: Diagnosis not present

## 2019-03-08 DIAGNOSIS — Z1211 Encounter for screening for malignant neoplasm of colon: Secondary | ICD-10-CM | POA: Diagnosis not present

## 2019-03-08 DIAGNOSIS — K219 Gastro-esophageal reflux disease without esophagitis: Secondary | ICD-10-CM | POA: Diagnosis not present

## 2019-04-16 DIAGNOSIS — D500 Iron deficiency anemia secondary to blood loss (chronic): Secondary | ICD-10-CM | POA: Diagnosis not present

## 2019-04-16 DIAGNOSIS — E559 Vitamin D deficiency, unspecified: Secondary | ICD-10-CM | POA: Diagnosis not present

## 2019-11-16 ENCOUNTER — Other Ambulatory Visit (HOSPITAL_COMMUNITY)
Admission: RE | Admit: 2019-11-16 | Discharge: 2019-11-16 | Disposition: A | Discharge status: Home / Self Care | Payer: BC Managed Care – PPO | Source: Ambulatory Visit | Attending: Family Medicine | Admitting: Family Medicine

## 2019-11-16 ENCOUNTER — Other Ambulatory Visit: Payer: Self-pay | Admitting: Family Medicine

## 2019-11-16 DIAGNOSIS — Z113 Encounter for screening for infections with a predominantly sexual mode of transmission: Secondary | ICD-10-CM | POA: Diagnosis not present

## 2019-11-16 DIAGNOSIS — R350 Frequency of micturition: Secondary | ICD-10-CM | POA: Diagnosis not present

## 2019-11-16 DIAGNOSIS — D500 Iron deficiency anemia secondary to blood loss (chronic): Secondary | ICD-10-CM | POA: Diagnosis not present

## 2019-11-16 DIAGNOSIS — Z124 Encounter for screening for malignant neoplasm of cervix: Secondary | ICD-10-CM | POA: Diagnosis present

## 2019-11-16 DIAGNOSIS — Z0000 Encounter for general adult medical examination without abnormal findings: Secondary | ICD-10-CM | POA: Diagnosis not present

## 2019-11-16 DIAGNOSIS — Z13220 Encounter for screening for lipoid disorders: Secondary | ICD-10-CM | POA: Diagnosis not present

## 2019-11-16 DIAGNOSIS — E559 Vitamin D deficiency, unspecified: Secondary | ICD-10-CM | POA: Diagnosis not present

## 2019-11-17 DIAGNOSIS — L811 Chloasma: Secondary | ICD-10-CM | POA: Diagnosis not present

## 2019-11-18 LAB — CYTOLOGY - PAP
Chlamydia: NEGATIVE
Comment: NEGATIVE
Comment: NORMAL
Diagnosis: NEGATIVE
Neisseria Gonorrhea: NEGATIVE

## 2020-04-26 ENCOUNTER — Other Ambulatory Visit: Payer: Self-pay

## 2020-04-26 ENCOUNTER — Encounter: Payer: Self-pay | Admitting: Physical Therapy

## 2020-04-26 ENCOUNTER — Ambulatory Visit: Payer: 59 | Attending: Family Medicine | Admitting: Physical Therapy

## 2020-04-26 DIAGNOSIS — R519 Headache, unspecified: Secondary | ICD-10-CM | POA: Diagnosis present

## 2020-04-26 DIAGNOSIS — R293 Abnormal posture: Secondary | ICD-10-CM | POA: Diagnosis present

## 2020-04-26 DIAGNOSIS — G8929 Other chronic pain: Secondary | ICD-10-CM | POA: Insufficient documentation

## 2020-04-26 DIAGNOSIS — M542 Cervicalgia: Secondary | ICD-10-CM | POA: Insufficient documentation
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
## 2020-05-17 ENCOUNTER — Encounter: Payer: 59 | Admitting: Physical Therapy

## 2020-05-17 ENCOUNTER — Ambulatory Visit: Payer: 59 | Admitting: Physical Therapy

## 2020-05-17 ENCOUNTER — Other Ambulatory Visit: Payer: Self-pay

## 2020-05-19 ENCOUNTER — Ambulatory Visit: Payer: 59 | Admitting: Physical Therapy

## 2020-05-22 ENCOUNTER — Encounter: Payer: 59 | Admitting: Physical Therapy

## 2020-05-24 ENCOUNTER — Encounter: Payer: 59 | Admitting: Physical Therapy

## 2020-05-29 ENCOUNTER — Other Ambulatory Visit: Payer: Self-pay | Admitting: Family Medicine

## 2020-05-29 ENCOUNTER — Encounter: Payer: 59 | Admitting: Physical Therapy

## 2020-05-29 DIAGNOSIS — Z1231 Encounter for screening mammogram for malignant neoplasm of breast: Secondary | ICD-10-CM

## 2020-05-30 ENCOUNTER — Other Ambulatory Visit: Payer: Self-pay

## 2020-05-30 ENCOUNTER — Ambulatory Visit
Admission: RE | Admit: 2020-05-30 | Discharge: 2020-05-30 | Disposition: A | Discharge status: Home / Self Care | Payer: 59 | Source: Ambulatory Visit | Attending: Family Medicine | Admitting: Family Medicine

## 2020-05-30 DIAGNOSIS — Z1231 Encounter for screening mammogram for malignant neoplasm of breast: Secondary | ICD-10-CM

## 2020-05-31 ENCOUNTER — Encounter: Payer: 59 | Admitting: Physical Therapy

## 2020-06-05 ENCOUNTER — Encounter: Payer: 59 | Admitting: Physical Therapy

## 2020-06-07 ENCOUNTER — Encounter: Payer: 59 | Admitting: Physical Therapy

## 2020-07-10 IMAGING — MG DIGITAL SCREENING BILAT W/ TOMO W/ CAD
8 series · 8 of 24 positions shown · non-contrast
Comparison: Previous exam(s).

CLINICAL DATA: Screening.

EXAM:
DIGITAL SCREENING BILATERAL MAMMOGRAM WITH TOMO AND CAD

[R CC synth-2D]
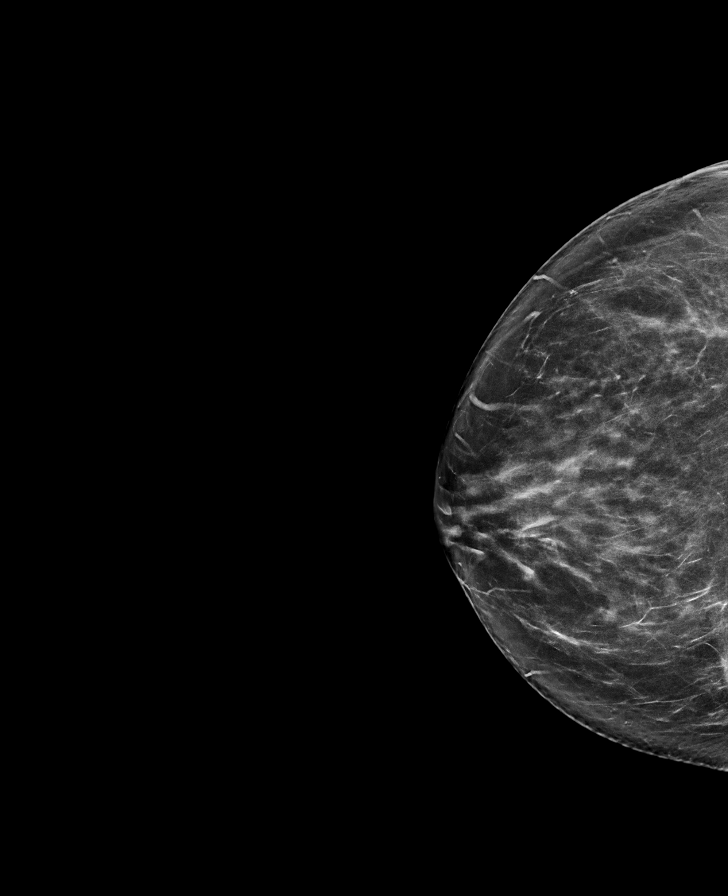

[R MLO synth-2D]
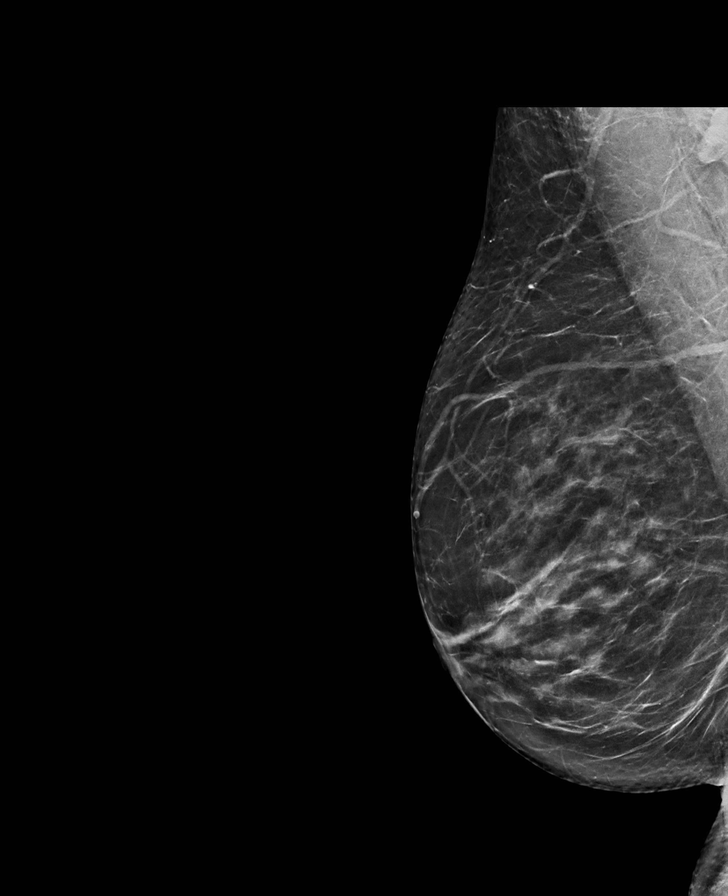

[L CC synth-2D]
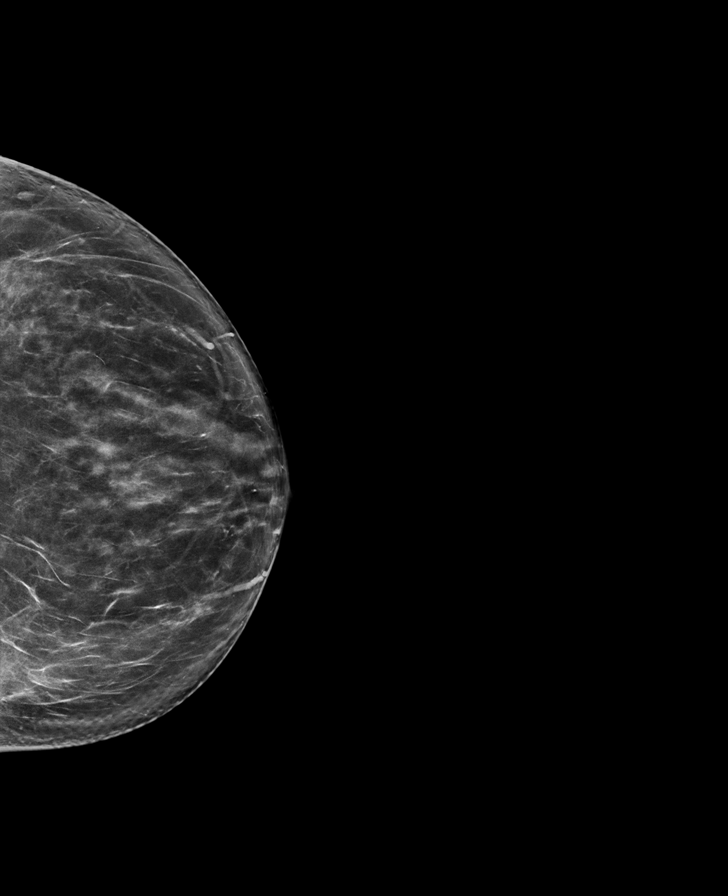

[L MLO synth-2D]
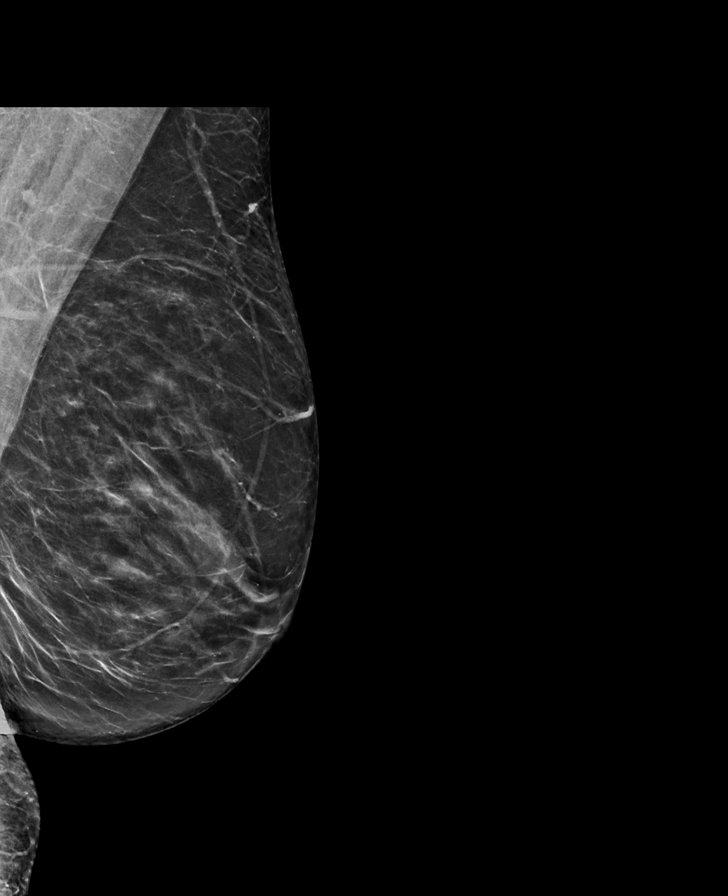

[L MLO tomo · tomo slice 37/74.0]
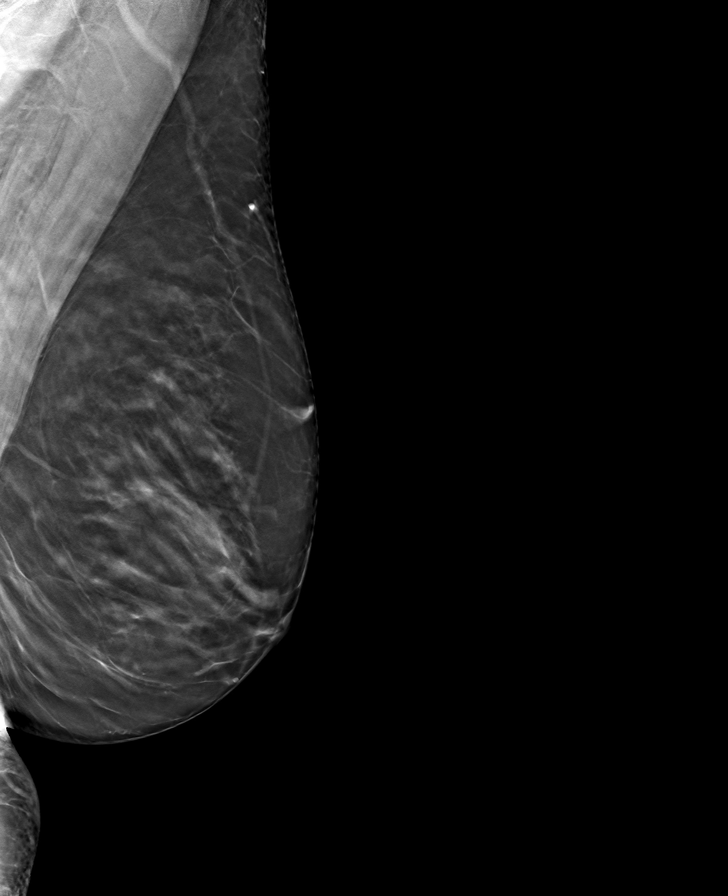

[L CC tomo · tomo slice 39/76.0]
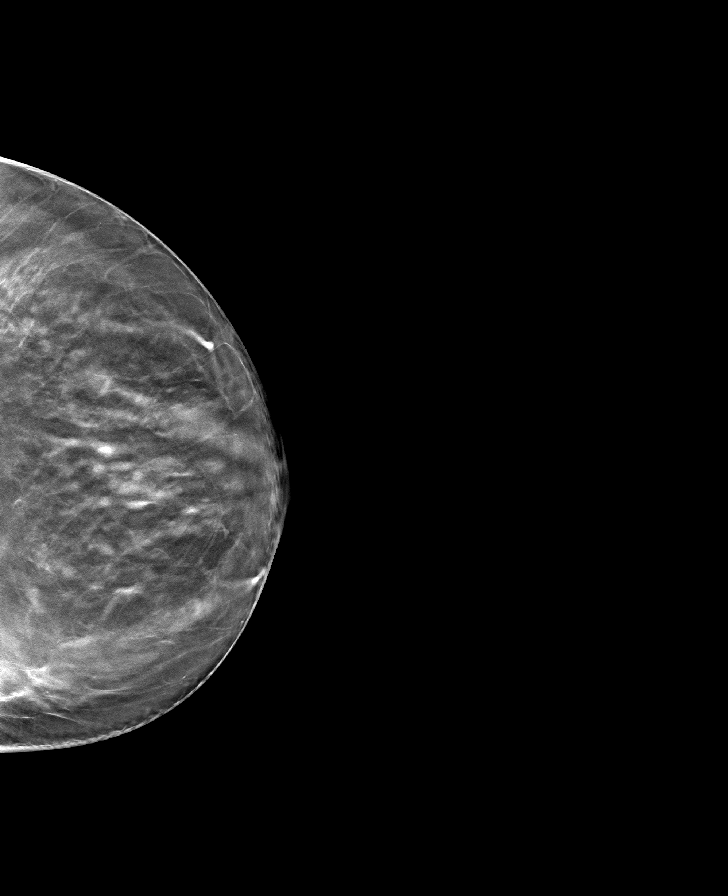

[R MLO tomo · tomo slice 37/73.0]
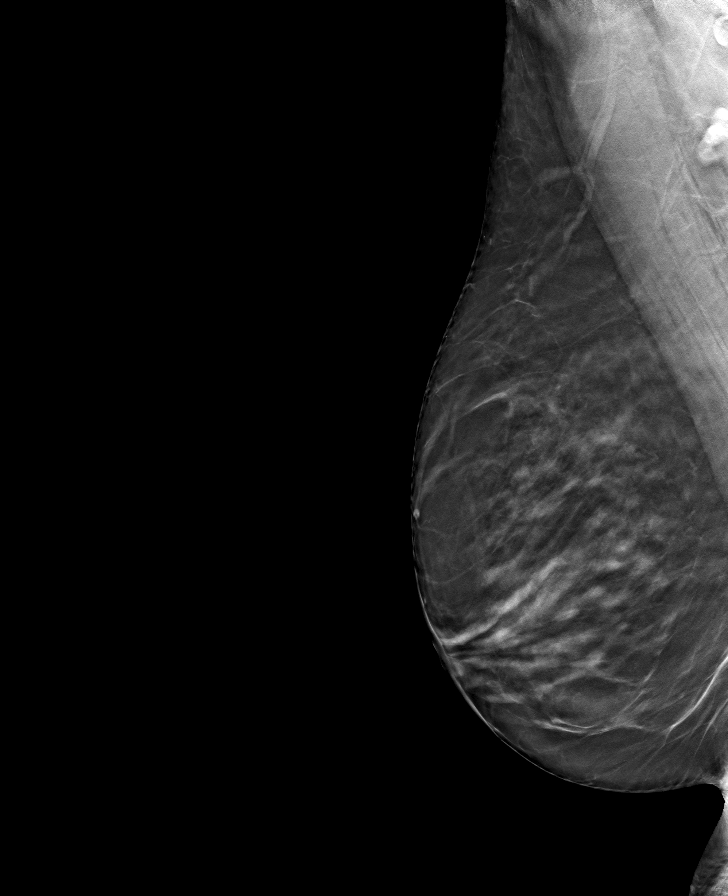

[R CC tomo · tomo slice 38/75.0]
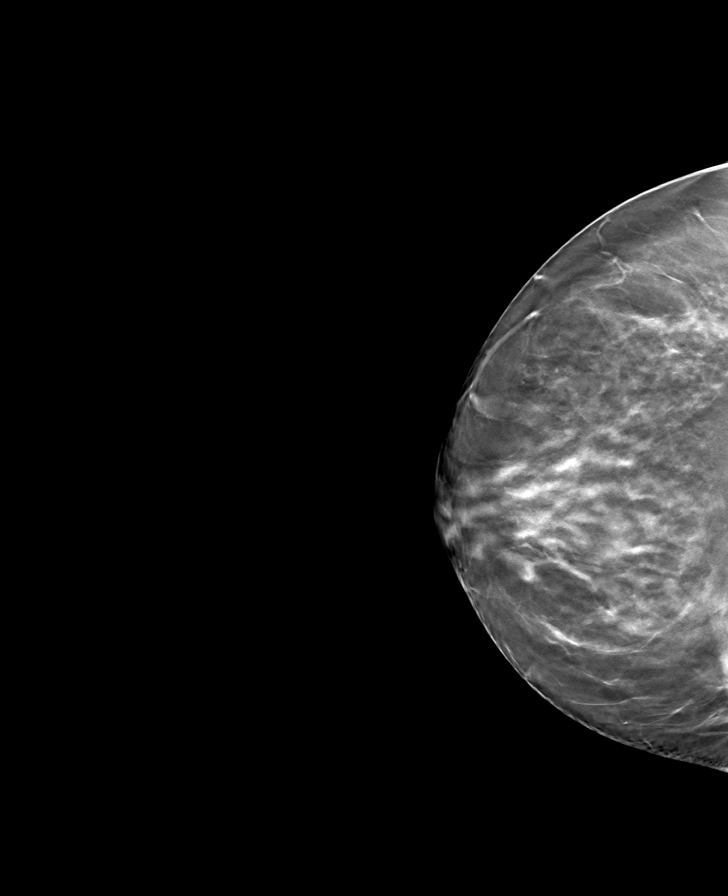

[8 of 24 positions shown; findings below may reference images not displayed]

ACR Breast Density Category b: There are scattered areas of
fibroglandular density.
FINDINGS: There are no findings suspicious for malignancy. Images were
processed with CAD.
IMPRESSION: No mammographic evidence of malignancy. A result letter of this
screening mammogram will be mailed directly to the patient.

RECOMMENDATION:
Screening mammogram in one year. (Code:[id number])

BI-RADS CATEGORY  1: Negative.

## 2020-09-14 ENCOUNTER — Encounter: Payer: Self-pay | Admitting: *Deleted

## 2020-09-15 ENCOUNTER — Ambulatory Visit (INDEPENDENT_AMBULATORY_CARE_PROVIDER_SITE_OTHER): Payer: 59 | Admitting: Neurology

## 2020-09-15 ENCOUNTER — Encounter: Payer: Self-pay | Admitting: Neurology

## 2020-09-15 ENCOUNTER — Other Ambulatory Visit: Payer: Self-pay

## 2020-09-15 VITALS — BP 137/83 | HR 98 | Ht 66.0 in | Wt 163.0 lb

## 2020-09-15 DIAGNOSIS — R51 Headache with orthostatic component, not elsewhere classified: Secondary | ICD-10-CM | POA: Diagnosis not present

## 2020-09-15 DIAGNOSIS — H539 Unspecified visual disturbance: Secondary | ICD-10-CM | POA: Diagnosis not present

## 2020-09-15 DIAGNOSIS — H5460 Unqualified visual loss, one eye, unspecified: Secondary | ICD-10-CM | POA: Diagnosis not present

## 2020-09-15 DIAGNOSIS — R519 Headache, unspecified: Secondary | ICD-10-CM | POA: Diagnosis not present

## 2020-09-15 MED ORDER — PREDNISONE 20 MG PO TABS: 60.0000 mg | ORAL_TABLET | Freq: Every day | ORAL | 0 refills | Status: DC
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
## 2020-09-15 NOTE — Patient Instructions (Signed)
MRI brain Bloodwork Start Prednisone   Temporal Arteritis  Temporal arteritis is a condition that causes arteries to become inflamed. It usually affects arteries in your head and face, but arteries in any part of the body can become inflamed. The condition is also called giant cell arteritis.  Temporal arteritis can cause serious problems such as blindness. Early treatment can help prevent these problems. What are the causes? The cause of this condition is not known. What increases the risk? The following factors may make you more likely to develop this condition:  Being older than 50.  Being a woman.  Being Caucasian.  Being of [place], [place], [place], [place], or [place] ancestry.  Having a family history of the condition.  Having a certain condition that causes muscle pain and stiffness (polymyalgia rheumatica, PMR). What are the signs or symptoms? Some people with temporal arteritis have just one symptom, while others have several symptoms. Most symptoms are related to the head and face. These may include:  Headache.  Hard or swollen temples. This is common. Your temples are the areas on either side of your forehead. If your temples are swollen, it may hurt to touch them.  Pain when combing your hair or when laying your head down.  Pain in the jaw when chewing.  Pain in the throat or tongue.  Problems with your vision, such as sudden loss of vision in one eye, or seeing double. Other symptoms may include:  Fever.  Tiredness (fatigue).  A dry cough.  Pain in the hips and shoulders.  Pain in the arms during exercise.  Depression.  Weight loss. How is this diagnosed? This condition may be diagnosed based on:  Your symptoms.  Your medical history.  A physical exam.  Tests, including: ? Blood tests. ? A test in which a tissue sample is removed from an artery so it can be examined (biopsy). ? Imaging tests, such as an ultrasound or MRI. How is  this treated? This condition may be treated with:  A type of medicine to reduce inflammation (corticosteroid).  Medicines to weaken your immune system (immunosuppressants).  Other medicines to treat vision problems. You will need to see your health care provider while you are being treated. The medicines used to treat this condition can increase your risk of problems such as bone loss and diabetes. During follow-up visits, your health care provider will check for problems by:  Doing blood tests and bone density tests.  Checking your blood pressure and blood sugar. Follow these instructions at home: Medicines  Take over-the-counter and prescription medicines only as told by your health care provider.  Take any vitamins or supplements recommended by your health care provider. These may include vitamin D and calcium, which help keep your bones from becoming weak. Eating and drinking   Eat a heart-healthy diet. This may include: ? Eating high-fiber foods, such as fresh fruits and vegetables, whole grains, and beans. ? Eating heart-healthy fats (omega-3 fats), such as fish, flaxseed, and flaxseed oil. ? Limiting foods that are high in saturated fat and cholesterol, such as processed and fried foods, fatty meat, and full-fat dairy. ? Limiting how much salt (sodium) you eat.  Include calcium and vitamin D in your diet. Good sources of calcium and vitamin D include: ? Low-fat dairy products such as milk, yogurt, and cheese. ? Certain fish, such as fresh or canned salmon, tuna, and sardines. ? Products that have calcium and vitamin D added to them (fortified products), such as fortified  cereals or juice. General instructions  Exercise. Talk with your health care provider about what exercises are okay for you. Exercises that increase your heart rate (aerobic exercise), such as walking, are often recommended. Aerobic exercise helps control your blood pressure and prevent bone loss.  Stay up  to date on all vaccines as directed by your health care provider.  Keep all follow-up visits as told by your health care provider. This is important. Contact a health care provider if:  Your symptoms get worse.  You develop signs of infection, such as fever, swelling, redness, warmth, and tenderness. Get help right away if:  You lose your vision.  Your pain does not go away, even after you take medicine.  You have chest pain.  You have trouble breathing.  One side of your face or body suddenly becomes weak or numb. These symptoms may represent a serious problem that is an emergency. Do not wait to see if the symptoms will go away. Get medical help right away. Call your local emergency services (911 in the [place] Do not drive yourself to the hospital. Summary  Temporal arteritis is a condition that causes arteries to become inflamed. It usually affects arteries in your head and face.  This condition can cause serious problems, such as blindness. Treatment can help prevent these problems.  Symptoms may include hard or tender temples, pain in your jaw when chewing, problems with your vision, or pain in your hips and shoulders.  Take over-the-counter and prescription medicines as told by your health care provider. This information is not intended to replace advice given to you by your health care provider. Make sure you discuss any questions you have with your health care provider. Document Revised: 01/15/2018 Document Reviewed: 01/13/2018 Elsevier Patient Education  2020 Elsevier Inc.   Prednisone tablets What is this medicine? PREDNISONE (PRED ni sone) is a corticosteroid. It is commonly used to treat inflammation of the skin, joints, lungs, and other organs. Common conditions treated include asthma, allergies, and arthritis. It is also used for other conditions, such as blood disorders and diseases of the adrenal glands. This medicine may be used for other purposes; ask your  health care provider or pharmacist if you have questions. COMMON BRAND NAME(S): Deltasone, Predone, Sterapred, Sterapred DS What should I tell my health care provider before I take this medicine? They need to know if you have any of these conditions:  Cushing's syndrome  diabetes  glaucoma  heart disease  high blood pressure  infection (especially a virus infection such as chickenpox, cold sores, or herpes)  kidney disease  liver disease  mental illness  myasthenia gravis  osteoporosis  seizures  stomach or intestine problems  thyroid disease  an unusual or allergic reaction to lactose, prednisone, other medicines, foods, dyes, or preservatives  pregnant or trying to get pregnant  breast-feeding How should I use this medicine? Take this medicine by mouth with a glass of water. Follow the directions on the prescription label. Take this medicine with food. If you are taking this medicine once a day, take it in the morning. Do not take more medicine than you are told to take. Do not suddenly stop taking your medicine because you may develop a severe reaction. Your doctor will tell you how much medicine to take. If your doctor wants you to stop the medicine, the dose may be slowly lowered over time to avoid any side effects. Talk to your pediatrician regarding the use of this medicine in children.  Special care may be needed. Overdosage: If you think you have taken too much of this medicine contact a poison control center or emergency room at once. NOTE: This medicine is only for you. Do not share this medicine with others. What if I miss a dose? If you miss a dose, take it as soon as you can. If it is almost time for your next dose, talk to your doctor or health care professional. You may need to miss a dose or take an extra dose. Do not take double or extra doses without advice. What may interact with this medicine? Do not take this medicine with any of the following  medications:  metyrapone  mifepristone This medicine may also interact with the following medications:  aminoglutethimide  amphotericin B  aspirin and aspirin-like medicines  barbiturates  certain medicines for diabetes, like glipizide or glyburide  cholestyramine  cholinesterase inhibitors  cyclosporine  digoxin  diuretics  ephedrine  female hormones, like estrogens and birth control pills  isoniazid  ketoconazole  NSAIDS, medicines for pain and inflammation, like ibuprofen or naproxen  phenytoin  rifampin  toxoids  vaccines  warfarin This list may not describe all possible interactions. Give your health care provider a list of all the medicines, herbs, non-prescription drugs, or dietary supplements you use. Also tell them if you smoke, drink alcohol, or use illegal drugs. Some items may interact with your medicine. What should I watch for while using this medicine? Visit your doctor or health care professional for regular checks on your progress. If you are taking this medicine over a prolonged period, carry an identification card with your name and address, the type and dose of your medicine, and your doctor's name and address. This medicine may increase your risk of getting an infection. Tell your doctor or health care professional if you are around anyone with measles or chickenpox, or if you develop sores or blisters that do not heal properly. If you are going to have surgery, tell your doctor or health care professional that you have taken this medicine within the last twelve months. Ask your doctor or health care professional about your diet. You may need to lower the amount of salt you eat. This medicine may increase blood sugar. Ask your healthcare provider if changes in diet or medicines are needed if you have diabetes. What side effects may I notice from receiving this medicine? Side effects that you should report to your doctor or health care  professional as soon as possible:  allergic reactions like skin rash, itching or hives, swelling of the face, lips, or tongue  changes in emotions or moods  changes in vision  depressed mood  eye pain  fever or chills, cough, sore throat, pain or difficulty passing urine  signs and symptoms of high blood sugar such as being more thirsty or hungry or having to urinate more than normal. You may also feel very tired or have blurry vision.  swelling of ankles, feet Side effects that usually do not require medical attention (report to your doctor or health care professional if they continue or are bothersome):  confusion, excitement, restlessness  headache  nausea, vomiting  skin problems, acne, thin and shiny skin  trouble sleeping  weight gain This list may not describe all possible side effects. Call your doctor for medical advice about side effects. You may report side effects to FDA at 1-800-FDA-1088. Where should I keep my medicine? Keep out of the reach of children. Store at room  temperature between 15 and 30 degrees C (59 and 86 degrees F). Protect from light. Keep container tightly closed. Throw away any unused medicine after the expiration date. NOTE: This sheet is a summary. It may not cover all possible information. If you have questions about this medicine, talk to your doctor, pharmacist, or health care provider.  2020 Elsevier/Gold Standard (2018-09-01 10:54:22)

## 2020-09-16 LAB — CBC
Hematocrit: 32.7 % — ABNORMAL LOW (ref 34.0–46.6)
Hemoglobin: 10.6 g/dL — ABNORMAL LOW (ref 11.1–15.9)
MCH: 30.5 pg (ref 26.6–33.0)
MCHC: 32.4 g/dL (ref 31.5–35.7)
MCV: 94 fL (ref 79–97)
Platelets: 374 10*3/uL (ref 150–450)
RBC: 3.47 x10E6/uL — ABNORMAL LOW (ref 3.77–5.28)
RDW: 11.9 % (ref 11.7–15.4)
WBC: 4 10*3/uL (ref 3.4–10.8)

## 2020-09-16 LAB — C-REACTIVE PROTEIN: CRP: 3 mg/L (ref 0–10)

## 2020-09-16 LAB — COMPREHENSIVE METABOLIC PANEL
ALT: 13 IU/L (ref 0–32)
AST: 17 IU/L (ref 0–40)
Albumin/Globulin Ratio: 1.3 (ref 1.2–2.2)
Albumin: 4.4 g/dL (ref 3.8–4.9)
Alkaline Phosphatase: 105 IU/L (ref 44–121)
BUN/Creatinine Ratio: 15 (ref 9–23)
BUN: 13 mg/dL (ref 6–24)
Bilirubin Total: 0.2 mg/dL (ref 0.0–1.2)
CO2: 24 mmol/L (ref 20–29)
Calcium: 9.5 mg/dL (ref 8.7–10.2)
Chloride: 100 mmol/L (ref 96–106)
Creatinine, Ser: 0.89 mg/dL (ref 0.57–1.00)
GFR calc Af Amer: 83 mL/min/{1.73_m2} (ref >59)
GFR calc non Af Amer: 72 mL/min/{1.73_m2} (ref >59)
Globulin, Total: 3.4 g/dL (ref 1.5–4.5)
Glucose: 87 mg/dL (ref 65–99)
Potassium: 4.8 mmol/L (ref 3.5–5.2)
Sodium: 138 mmol/L (ref 134–144)
Total Protein: 7.8 g/dL (ref 6.0–8.5)

## 2020-09-16 LAB — SEDIMENTATION RATE: Sed Rate: 57 mm/h — ABNORMAL HIGH (ref 0–40)

## 2020-09-18 ENCOUNTER — Telehealth: Payer: Self-pay | Admitting: *Deleted

## 2020-09-18 ENCOUNTER — Telehealth: Payer: Self-pay | Admitting: Neurology

## 2020-09-18 ENCOUNTER — Other Ambulatory Visit: Payer: Self-pay | Admitting: Neurology

## 2020-09-18 DIAGNOSIS — M316 Other giant cell arteritis: Secondary | ICD-10-CM

## 2020-09-18 NOTE — Telephone Encounter (Signed)
Called pt and asked for a call back as soon as possible. Left office number in message.

## 2020-09-18 NOTE — Telephone Encounter (Signed)
She can continue that is totally fine.  Shannon Estes, does [facility] perform the temporal artery biopsy as well? If so, can they see her sooner than [facility]? Would you mind finding out? Thanks!

## 2020-09-18 NOTE — Telephone Encounter (Signed)
[facility] auth: [id number] (exp. 09/18/20 to 12/17/20) order sent to GI. They will reach out to the patient to schedule.

## 2020-09-18 NOTE — Progress Notes (Signed)
surgery

## 2020-09-18 NOTE — Telephone Encounter (Signed)
Pt returned call. Please call back when available. 

## 2020-09-18 NOTE — Telephone Encounter (Signed)
I called the pt back and discussed the message from Dr Lucia Gaskins. Pt is willing to move forward with temporal artery biopsy. She is aware the first appt will be a consultation. Pt's MRI is scheduled for 09/29/20. Pt understands to continue the steroids. Pt also asked if it's ok to continue taking the Gabapentin and Flexeril that she was previously prescribed. Pt's questions were answered. She verbalized appreciation for the call and is aware [facility] will call her to schedule.

## 2020-09-18 NOTE — Telephone Encounter (Signed)
[facility] pending  

## 2020-09-18 NOTE — Telephone Encounter (Signed)
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 I think she should have a temporal artery biopsy which is the only way to tell for sure. In the meantime staying on the steroid is indicated. Let me know if she would like to go forward, she will meet with surgery first prior to the biospy for an informational meeting

## 2020-09-19 NOTE — Telephone Encounter (Signed)
I called the pt and LMOM (ok per DPR) advising Dr Lucia Gaskins is completely fine with pt continuing the Gabapentin and Flexeril. Also advised her we will be working on the referral and their office will be in touch to schedule. Left our office number in message for call back if she has any questions.

## 2020-09-19 NOTE — Telephone Encounter (Signed)
[facility] is seeing the patient 09/22/2020 Dr. Arnoldo Morale . Patient is Aware .

## 2020-09-19 NOTE — Telephone Encounter (Signed)
Wonderful. Thank you.

## 2020-09-25 ENCOUNTER — Other Ambulatory Visit: Payer: Self-pay | Admitting: General Surgery

## 2020-09-29 ENCOUNTER — Other Ambulatory Visit: Payer: Self-pay

## 2020-09-29 ENCOUNTER — Ambulatory Visit
Admission: RE | Admit: 2020-09-29 | Discharge: 2020-09-29 | Disposition: A | Discharge status: Home / Self Care | Payer: 59 | Source: Ambulatory Visit | Attending: Neurology | Admitting: Neurology

## 2020-09-29 DIAGNOSIS — H539 Unspecified visual disturbance: Secondary | ICD-10-CM

## 2020-09-29 DIAGNOSIS — R51 Headache with orthostatic component, not elsewhere classified: Secondary | ICD-10-CM

## 2020-09-29 DIAGNOSIS — H5460 Unqualified visual loss, one eye, unspecified: Secondary | ICD-10-CM

## 2020-09-29 DIAGNOSIS — R519 Headache, unspecified: Secondary | ICD-10-CM

## 2020-09-29 MED ORDER — GADOBENATE DIMEGLUMINE 529 MG/ML IV SOLN
20.0000 mL | Freq: Once | INTRAVENOUS | Status: AC | PRN
  Administered 2020-09-29: 20 mL via INTRAVENOUS

## 2020-10-02 ENCOUNTER — Telehealth: Payer: Self-pay | Admitting: *Deleted

## 2020-10-02 NOTE — Telephone Encounter (Signed)
-----   Message from Anson Fret, MD sent at 10/01/2020  9:45 PM EDT ----- MRI of the brain unremarkable, white matter changes consistent with age. Can you call and discuss results and also see if she had the biopsy and if she is still on steroids? Thank you.
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
## 2020-10-04 ENCOUNTER — Encounter (HOSPITAL_BASED_OUTPATIENT_CLINIC_OR_DEPARTMENT_OTHER): Payer: Self-pay | Admitting: General Surgery

## 2020-10-04 ENCOUNTER — Other Ambulatory Visit: Payer: Self-pay

## 2020-10-06 NOTE — Progress Notes (Signed)
      Enhanced Recovery after Surgery for Orthopedics Enhanced Recovery after Surgery is a protocol used to improve the stress on your body and your recovery after surgery.  Patient Instructions  . The night before surgery:  o No food after midnight. ONLY clear liquids after midnight  . The day of surgery (if you do NOT have diabetes):  o Drink ONE (1) Pre-Surgery Clear Ensure as directed.   o This drink was given to you during your hospital  pre-op appointment visit. o The pre-op nurse will instruct you on the time to drink the  Pre-Surgery Ensure depending on your surgery time. o Finish the drink at the designated time by the pre-op nurse.  o Nothing else to drink after completing the  Pre-Surgery Clear Ensure.  . The day of surgery (if you have diabetes): o Drink ONE (1) Gatorade 2 (G2) as directed. o This drink was given to you during your hospital  pre-op appointment visit.  o The pre-op nurse will instruct you on the time to drink the   Gatorade 2 (G2) depending on your surgery time. o Color of the Gatorade may vary. Red is not allowed. o Nothing else to drink after completing the  Gatorade 2 (G2).         If you have questions, please contact your surgeon's office.  Surgical soap given, patient verbalized understanding.

## 2020-10-09 ENCOUNTER — Other Ambulatory Visit (HOSPITAL_COMMUNITY)
Admission: RE | Admit: 2020-10-09 | Discharge: 2020-10-09 | Disposition: A | Discharge status: Home / Self Care | Payer: 59 | Source: Ambulatory Visit | Attending: General Surgery | Admitting: General Surgery

## 2020-10-09 DIAGNOSIS — Z01812 Encounter for preprocedural laboratory examination: Secondary | ICD-10-CM | POA: Diagnosis present

## 2020-10-09 DIAGNOSIS — Z20822 Contact with and (suspected) exposure to covid-19: Secondary | ICD-10-CM | POA: Diagnosis not present

## 2020-10-09 LAB — SARS CORONAVIRUS 2 (TAT 6-24 HRS): SARS Coronavirus 2: NEGATIVE
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
## 2020-10-12 ENCOUNTER — Ambulatory Visit (HOSPITAL_BASED_OUTPATIENT_CLINIC_OR_DEPARTMENT_OTHER): Payer: 59 | Admitting: Certified Registered"

## 2020-10-12 ENCOUNTER — Other Ambulatory Visit: Payer: Self-pay

## 2020-10-12 ENCOUNTER — Encounter (HOSPITAL_BASED_OUTPATIENT_CLINIC_OR_DEPARTMENT_OTHER): Payer: Self-pay | Admitting: General Surgery

## 2020-10-12 ENCOUNTER — Encounter (HOSPITAL_BASED_OUTPATIENT_CLINIC_OR_DEPARTMENT_OTHER)
Admission: RE | Disposition: A | Discharge status: Home / Self Care | Payer: Self-pay | Source: Home / Self Care | Attending: General Surgery

## 2020-10-12 ENCOUNTER — Ambulatory Visit (HOSPITAL_BASED_OUTPATIENT_CLINIC_OR_DEPARTMENT_OTHER)
Admission: RE | Admit: 2020-10-12 | Discharge: 2020-10-12 | Disposition: A | Discharge status: Home / Self Care | Payer: 59 | Attending: General Surgery | Admitting: General Surgery

## 2020-10-12 DIAGNOSIS — R519 Headache, unspecified: Secondary | ICD-10-CM | POA: Diagnosis present

## 2020-10-12 HISTORY — DX: Gastro-esophageal reflux disease without esophagitis: K21.9

## 2020-10-12 HISTORY — PX: ARTERY BIOPSY: SHX891

## 2020-10-12 SURGERY — BIOPSY TEMPORAL ARTERY
Anesthesia: General | Site: Head | Laterality: Left

## 2020-10-12 MED ORDER — MIDAZOLAM HCL 2 MG/2ML IJ SOLN
INTRAMUSCULAR | Status: AC
  Filled 2020-10-12: qty 2

## 2020-10-12 MED ORDER — OXYCODONE HCL 5 MG PO TABS: 5.0000 mg | ORAL_TABLET | Freq: Once | ORAL | Status: DC | PRN

## 2020-10-12 MED ORDER — FENTANYL CITRATE (PF) 100 MCG/2ML IJ SOLN
INTRAMUSCULAR | Status: DC | PRN
  Administered 2020-10-12: 25 ug via INTRAVENOUS
  Administered 2020-10-12: 100 ug via INTRAVENOUS

## 2020-10-12 MED ORDER — ONDANSETRON HCL 4 MG/2ML IJ SOLN
INTRAMUSCULAR | Status: DC | PRN
  Administered 2020-10-12: 4 mg via INTRAVENOUS

## 2020-10-12 MED ORDER — PHENYLEPHRINE 40 MCG/ML (10ML) SYRINGE FOR IV PUSH (FOR BLOOD PRESSURE SUPPORT)
PREFILLED_SYRINGE | INTRAVENOUS | Status: DC | PRN
  Administered 2020-10-12: 80 ug via INTRAVENOUS

## 2020-10-12 MED ORDER — FENTANYL CITRATE (PF) 100 MCG/2ML IJ SOLN: 25.0000 ug | INTRAMUSCULAR | Status: DC | PRN

## 2020-10-12 MED ORDER — MIDAZOLAM HCL 5 MG/5ML IJ SOLN
INTRAMUSCULAR | Status: DC | PRN
  Administered 2020-10-12: 2 mg via INTRAVENOUS

## 2020-10-12 MED ORDER — LIDOCAINE 2% (20 MG/ML) 5 ML SYRINGE
INTRAMUSCULAR | Status: DC | PRN
  Administered 2020-10-12: 60 mg via INTRAVENOUS

## 2020-10-12 MED ORDER — PHENYLEPHRINE 40 MCG/ML (10ML) SYRINGE FOR IV PUSH (FOR BLOOD PRESSURE SUPPORT)
PREFILLED_SYRINGE | INTRAVENOUS | Status: AC
  Filled 2020-10-12: qty 10

## 2020-10-12 MED ORDER — CEFAZOLIN SODIUM-DEXTROSE 2-4 GM/100ML-% IV SOLN
2.0000 g | INTRAVENOUS | Status: AC
  Administered 2020-10-12: 2 g via INTRAVENOUS

## 2020-10-12 MED ORDER — FENTANYL CITRATE (PF) 100 MCG/2ML IJ SOLN
INTRAMUSCULAR | Status: AC
  Filled 2020-10-12: qty 2

## 2020-10-12 MED ORDER — CHLORHEXIDINE GLUCONATE CLOTH 2 % EX PADS: 6.0000 | MEDICATED_PAD | Freq: Once | CUTANEOUS | Status: DC

## 2020-10-12 MED ORDER — LIDOCAINE 2% (20 MG/ML) 5 ML SYRINGE
INTRAMUSCULAR | Status: AC
  Filled 2020-10-12: qty 5

## 2020-10-12 MED ORDER — LACTATED RINGERS IV SOLN: INTRAVENOUS | Status: DC

## 2020-10-12 MED ORDER — PROMETHAZINE HCL 25 MG/ML IJ SOLN: 6.2500 mg | INTRAMUSCULAR | Status: DC | PRN

## 2020-10-12 MED ORDER — ACETAMINOPHEN 500 MG PO TABS
1000.0000 mg | ORAL_TABLET | ORAL | Status: AC
  Administered 2020-10-12: 1000 mg via ORAL

## 2020-10-12 MED ORDER — PROPOFOL 10 MG/ML IV BOLUS
INTRAVENOUS | Status: AC
  Filled 2020-10-12: qty 20

## 2020-10-12 MED ORDER — DEXAMETHASONE SODIUM PHOSPHATE 10 MG/ML IJ SOLN
INTRAMUSCULAR | Status: AC
  Filled 2020-10-12: qty 1

## 2020-10-12 MED ORDER — OXYCODONE HCL 5 MG PO TABS: 5.0000 mg | ORAL_TABLET | Freq: Four times a day (QID) | ORAL | 0 refills | Status: DC | PRN

## 2020-10-12 MED ORDER — PROPOFOL 10 MG/ML IV BOLUS
INTRAVENOUS | Status: DC | PRN
  Administered 2020-10-12: 200 mg via INTRAVENOUS

## 2020-10-12 MED ORDER — LIDOCAINE-EPINEPHRINE (PF) 1 %-1:200000 IJ SOLN
INTRAMUSCULAR | Status: DC | PRN
  Administered 2020-10-12: 5 mL via INTRAMUSCULAR

## 2020-10-12 MED ORDER — ONDANSETRON HCL 4 MG/2ML IJ SOLN
INTRAMUSCULAR | Status: AC
  Filled 2020-10-12: qty 2

## 2020-10-12 MED ORDER — DEXAMETHASONE SODIUM PHOSPHATE 4 MG/ML IJ SOLN
INTRAMUSCULAR | Status: DC | PRN
  Administered 2020-10-12: 10 mg via INTRAVENOUS

## 2020-10-12 MED ORDER — ACETAMINOPHEN 500 MG PO TABS
ORAL_TABLET | ORAL | Status: AC
  Filled 2020-10-12: qty 2

## 2020-10-12 MED ORDER — OXYCODONE HCL 5 MG/5ML PO SOLN: 5.0000 mg | Freq: Once | ORAL | Status: DC | PRN

## 2020-10-12 MED ORDER — CEFAZOLIN SODIUM-DEXTROSE 2-4 GM/100ML-% IV SOLN
INTRAVENOUS | Status: AC
  Filled 2020-10-12: qty 100

## 2020-10-12 SURGICAL SUPPLY — 37 items
BLADE CLIPPER SURG (BLADE) ×3 IMPLANT
BLADE HEX COATED 2.75 (ELECTRODE) ×3 IMPLANT
BLADE SURG 15 STRL LF DISP TIS (BLADE) ×1 IMPLANT
BLADE SURG 15 STRL SS (BLADE) ×2
CANISTER SUCT 1200ML W/VALVE (MISCELLANEOUS) IMPLANT
COTTONBALL LRG STERILE PKG (GAUZE/BANDAGES/DRESSINGS) ×3 IMPLANT
COVER BACK TABLE 60X90IN (DRAPES) ×3 IMPLANT
COVER MAYO STAND STRL (DRAPES) ×3 IMPLANT
DERMABOND ADVANCED (GAUZE/BANDAGES/DRESSINGS) ×2
DERMABOND ADVANCED .7 DNX12 (GAUZE/BANDAGES/DRESSINGS) ×1 IMPLANT
DRAPE LAPAROTOMY 100X72 PEDS (DRAPES) ×3 IMPLANT
ELECT REM PT RETURN 9FT ADLT (ELECTROSURGICAL) ×3
ELECTRODE REM PT RTRN 9FT ADLT (ELECTROSURGICAL) ×1 IMPLANT
GLOVE BIO SURGEON STRL SZ 6 (GLOVE) ×3 IMPLANT
GLOVE BIOGEL PI IND STRL 6.5 (GLOVE) ×1 IMPLANT
GLOVE BIOGEL PI INDICATOR 6.5 (GLOVE) ×2
GOWN STRL REUS W/ TWL LRG LVL3 (GOWN DISPOSABLE) ×1 IMPLANT
GOWN STRL REUS W/TWL 2XL LVL3 (GOWN DISPOSABLE) ×3 IMPLANT
GOWN STRL REUS W/TWL LRG LVL3 (GOWN DISPOSABLE) ×2
NEEDLE HYPO 25X1 1.5 SAFETY (NEEDLE) ×3 IMPLANT
NS IRRIG 1000ML POUR BTL (IV SOLUTION) ×3 IMPLANT
PACK BASIN DAY SURGERY FS (CUSTOM PROCEDURE TRAY) ×3 IMPLANT
PENCIL SMOKE EVACUATOR (MISCELLANEOUS) ×3 IMPLANT
SLEEVE SCD COMPRESS KNEE MED (MISCELLANEOUS) ×3 IMPLANT
SPONGE LAP 18X18 RF (DISPOSABLE) ×3 IMPLANT
SUT MNCRL AB 4-0 PS2 18 (SUTURE) ×3 IMPLANT
SUT SILK 3 0 TIES 17X18 (SUTURE) ×3
SUT SILK 3-0 18XBRD TIE BLK (SUTURE) ×1 IMPLANT
SUT VIC AB 3-0 SH 27 (SUTURE) ×2
SUT VIC AB 3-0 SH 27X BRD (SUTURE) ×1 IMPLANT
SUT VICRYL 4-0 PS2 18IN ABS (SUTURE) IMPLANT
SYR CONTROL 10ML LL (SYRINGE) ×3 IMPLANT
TOWEL GREEN STERILE FF (TOWEL DISPOSABLE) ×3 IMPLANT
TRAY DSU PREP LF (CUSTOM PROCEDURE TRAY) ×3 IMPLANT
TUBE CONNECTING 20'X1/4 (TUBING)
TUBE CONNECTING 20X1/4 (TUBING) IMPLANT
YANKAUER SUCT BULB TIP NO VENT (SUCTIONS) IMPLANT

## 2020-10-12 NOTE — Anesthesia Postprocedure Evaluation (Signed)
Anesthesia Post Note  Patient: Shannon Estes  Procedure(s) Performed: BIOPSY TEMPORAL ARTERY (Left Head)     Patient location during evaluation: PACU Anesthesia Type: General Level of consciousness: awake and alert Pain management: pain level controlled Vital Signs Assessment: post-procedure vital signs reviewed and stable Respiratory status: spontaneous breathing, nonlabored ventilation and respiratory function stable Cardiovascular status: blood pressure returned to baseline and stable Postop Assessment: no apparent nausea or vomiting Anesthetic complications: no   No complications documented.  Last Vitals:  Vitals:   10/12/20 1445 10/12/20 1500  BP: (!) 147/86 (!) 152/86  Pulse: 86 75  Resp: 19 12  Temp:    SpO2: 100% 97%    Last Pain:  Vitals:   10/12/20 1500  TempSrc:   PainSc: 0-No pain                 Beryle Lathe

## 2020-10-12 NOTE — Anesthesia Preprocedure Evaluation (Addendum)
Anesthesia Evaluation  Patient identified by MRN, date of birth, ID band Patient awake    Reviewed: Allergy & Precautions, NPO status , Patient's Chart, lab work & pertinent test results  History of Anesthesia Complications Negative for: history of anesthetic complications  Airway Mallampati: II  TM Distance: >3 FB Neck ROM: Full    Dental  (+) Dental Advisory Given   Pulmonary former smoker,    Pulmonary exam normal        Cardiovascular negative cardio ROS Normal cardiovascular exam     Neuro/Psych  Headaches, PSYCHIATRIC DISORDERS Depression    GI/Hepatic Neg liver ROS, GERD  Controlled and Medicated,  Endo/Other  diabetes Pre-DM   Renal/GU negative Renal ROS     Musculoskeletal negative musculoskeletal ROS (+)   Abdominal   Peds  Hematology negative hematology ROS (+)   Anesthesia Other Findings Covid test negative   Reproductive/Obstetrics                            Anesthesia Physical Anesthesia Plan  ASA: II  Anesthesia Plan: General   Post-op Pain Management:    Induction: Intravenous  PONV Risk Score and Plan: 3 and Treatment may vary due to age or medical condition, Ondansetron and Dexamethasone  Airway Management Planned: LMA  Additional Equipment: None  Intra-op Plan:   Post-operative Plan: Extubation in OR  Informed Consent: I have reviewed the patients History and Physical, chart, labs and discussed the procedure including the risks, benefits and alternatives for the proposed anesthesia with the patient or authorized representative who has indicated his/her understanding and acceptance.     Dental advisory given  Plan Discussed with: CRNA and Anesthesiologist  Anesthesia Plan Comments:        Anesthesia Quick Evaluation

## 2020-10-12 NOTE — Anesthesia Procedure Notes (Signed)
Procedure Name: LMA Insertion Date/Time: 10/12/2020 1:42 PM Performed by: Montez Morita, Marquin Patino W, CRNA Pre-anesthesia Checklist: Patient identified, Emergency Drugs available, Suction available and Patient being monitored Patient Re-evaluated:Patient Re-evaluated prior to induction Oxygen Delivery Method: Circle system utilized Preoxygenation: Pre-oxygenation with 100% oxygen Induction Type: IV induction Ventilation: Mask ventilation without difficulty LMA: LMA inserted LMA Size: 4.0 Number of attempts: 1 Placement Confirmation: positive ETCO2 and breath sounds checked- equal and bilateral Tube secured with: Tape Dental Injury: Teeth and Oropharynx as per pre-operative assessment

## 2020-10-12 NOTE — Op Note (Signed)
PRE-OPERATIVE DIAGNOSIS: headaches, visual changes, concern for temporal arteritis  POST-OPERATIVE DIAGNOSIS:  Same  PROCEDURE:  Procedure(s): Left temporal artery biopsy  SURGEON:  Surgeon(s): [name], MD  ANESTHESIA:   General  FINDINGS:  Small artery  LOCAL MEDICATIONS USED:  BUPIVICAINE  and LIDOCAINE   SPECIMEN:  Source of Specimen:  left temporal artery segment  DISPOSITION OF SPECIMEN:  PATHOLOGY  COUNTS:  YES  PLAN OF CARE: Discharge to home after PACU  PATIENT DISPOSITION:  PACU - hemodynamically stable.   PROCEDURE:   The patient was identified in the holding area, taken to the operating room, and was placed supine on the operating room table.  Bilateral temporal arteries were traced out with the Doppler. The side with the strongest signal was selected which was the left side.    The hair was clipped and the lateral upper face was prepped and draped in sterile fashion.  Timeout was performed according to the surgical safety checklist. When all was correct, we continued.  Local anesthetic was infiltrated in the skin above the temporal artery.  A longitudinal incision proximally 3 cm was made with the #15 blade.  The temporal artery was skeletonized with the small right angle clamp. Care was taken to avoid the branches of the facial nerve. Side branches of the temporal artery were ligated with 3-0 silk ties  A 2 cm segment of the artery was taken. The main temporal artery was ligated with 3-0 silk ties.  Hemostasis was achieved with cautery.    The incision was closed with 3-0 vicryl interrupted deep dermal sutures and 4-0 monocryl running subcuticular suture.  The wound was cleaned, dried, and dressed with dermabond.    Needle, sponge, and instrument counts were correct.  Pt emerged from anesthesia and was taken to PACU in stable condition.

## 2020-10-12 NOTE — Transfer of Care (Signed)
Immediate Anesthesia Transfer of Care Note  Patient: Shannon Estes  Procedure(s) Performed: BIOPSY TEMPORAL ARTERY (Left Head)  Patient Location: PACU  Anesthesia Type:General  Level of Consciousness: awake  Airway & Oxygen Therapy: Patient Spontanous Breathing and Patient connected to face mask oxygen  Post-op Assessment: Report given to RN and Post -op Vital signs reviewed and stable  Post vital signs: Reviewed and stable  Last Vitals:  Vitals Value Taken Time  BP    Temp    Pulse 86 10/12/20 1439  Resp 14 10/12/20 1439  SpO2 100 % 10/12/20 1439  Vitals shown include unvalidated device data.  Last Pain:  Vitals:   10/12/20 1152  TempSrc: Oral  PainSc: 0-No pain      Patients Stated Pain Goal: 5 (10/12/20 1152)  Complications: No complications documented.

## 2020-10-12 NOTE — Interval H&P Note (Signed)
History and Physical Interval Note:  10/12/2020 1:27 PM  Shannon Estes  has presented today for surgery, with the diagnosis of HEADACHE.  The various methods of treatment have been discussed with the patient and family. After consideration of risks, benefits and other options for treatment, the patient has consented to  Procedure(s): BIOPSY TEMPORAL ARTERY (N/A) as a surgical intervention.  The patient's history has been reviewed, patient examined, no change in status, stable for surgery.  I have reviewed the patient's chart and labs.  Questions were answered to the patient's satisfaction.     [name]

## 2020-10-12 NOTE — Discharge Instructions (Addendum)
[facility] Office Phone Number [phone]   POST OP INSTRUCTIONS  Always review your discharge instruction sheet given to you by the facility where your surgery was performed.  IF YOU HAVE DISABILITY OR FAMILY LEAVE FORMS, YOU MUST BRING THEM TO THE OFFICE FOR PROCESSING.  DO NOT GIVE THEM TO YOUR DOCTOR.  1. A prescription for pain medication may be given to you upon discharge.  Take your pain medication as prescribed, if needed.  If narcotic pain medicine is not needed, then you may take acetaminophen (Tylenol) or ibuprofen (Advil) as needed. 2. Take your usually prescribed medications unless otherwise directed 3. If you need a refill on your pain medication, please contact your pharmacy.  They will contact our office to request authorization.  Prescriptions will not be filled after 5pm or on week-ends. 4. You should eat very light the first 24 hours after surgery, such as soup, crackers, pudding, etc.  Resume your normal diet the day after surgery 5. It is common to experience some constipation if taking pain medication after surgery.  Increasing fluid intake and taking a stool softener will usually help or prevent this problem from occurring.  A mild laxative (Milk of Magnesia or Miralax) should be taken according to package directions if there are no bowel movements after 48 hours. 6. You may shower in 48 hours.  The surgical glue will flake off in 2-3 weeks.   7. ACTIVITIES:  No strenuous activity or heavy lifting for 1 week.   a. You may drive when you no longer are taking prescription pain medication, you can comfortably wear a seatbelt, and you can safely maneuver your car and apply brakes. b. RETURN TO WORK:  __________as tolerated if no lifting for 1 week_______________ You should see your doctor in the office for a follow-up appointment approximately three-four weeks after your surgery.    WHEN TO CALL YOUR DOCTOR: 1. Fever over 101.0 2. Nausea and/or  vomiting. 3. Extreme swelling or bruising. 4. Continued bleeding from incision. 5. Increased pain, redness, or drainage from the incision.  The clinic staff is available to answer your questions during regular business hours.  Please don't hesitate to call and ask to speak to one of the nurses for clinical concerns.  If you have a medical emergency, go to the nearest emergency room or call 911.  A surgeon from [facility] is always on call at the hospital.  For further questions, please visit [email or web address]   Post Anesthesia Home Care Instructions  Activity: Get plenty of rest for the remainder of the day. A responsible individual must stay with you for 24 hours following the procedure.  For the next 24 hours, DO NOT: -Drive a car -Advertising copywriter -Drink alcoholic beverages -Take any medication unless instructed by your physician -Make any legal decisions or sign important papers.  Meals: Start with liquid foods such as gelatin or soup. Progress to regular foods as tolerated. Avoid greasy, spicy, heavy foods. If nausea and/or vomiting occur, drink only clear liquids until the nausea and/or vomiting subsides. Call your physician if vomiting continues.  Special Instructions/Symptoms: Your throat may feel dry or sore from the anesthesia or the breathing tube placed in your throat during surgery. If this causes discomfort, gargle with warm salt water. The discomfort should disappear within 24 hours.  If you had a scopolamine patch placed behind your ear for the management of post- operative nausea and/or vomiting:  1. The medication in the patch is effective for 72  hours, after which it should be removed.  Wrap patch in a tissue and discard in the trash. Wash hands thoroughly with soap and water. 2. You may remove the patch earlier than 72 hours if you experience unpleasant side effects which may include dry mouth, dizziness or visual disturbances. 3. Avoid  touching the patch. Wash your hands with soap and water after contact with the patch.

## 2020-10-13 ENCOUNTER — Encounter (HOSPITAL_BASED_OUTPATIENT_CLINIC_OR_DEPARTMENT_OTHER): Payer: Self-pay | Admitting: General Surgery

## 2020-10-13 LAB — SURGICAL PATHOLOGY

## 2020-10-16 ENCOUNTER — Telehealth: Payer: Self-pay | Admitting: *Deleted

## 2020-10-16 NOTE — Telephone Encounter (Signed)
-----   Message from Anson Fret, MD sent at 10/14/2020 11:44 AM EDT ----- Temporal artery biopsy negative please ask her to stop the steroids ans lets see if the headache returns. She should go down to 2 tabs(40) daily for 3 days then 1 tab(20mg ) for three days then 1/2 tab(10mg ) for 3 days then stop.  thanks.

## 2020-10-16 NOTE — Telephone Encounter (Signed)
I called the pt and LVM asking for call back.  

## 2020-10-16 NOTE — Telephone Encounter (Signed)
The patient returned my call. She stated she saw the results and steroid dose taper recommendations on mychart. She started the reduction last Saturday and stated that today is her last day of taking 2 tablets, then she will take 1 daily tablet for 3 days then 1/2 tablet daily for 3 days then stop. The pt will monitor for any headache changes as she decreases and stops the medication and will call us with updates. She stated she has a follow up with the surgeon next Monday. She verbalized appreciation.
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
## 2020-12-25 ENCOUNTER — Other Ambulatory Visit: Payer: Self-pay

## 2020-12-25 ENCOUNTER — Ambulatory Visit (INDEPENDENT_AMBULATORY_CARE_PROVIDER_SITE_OTHER): Payer: 59 | Admitting: Neurology

## 2020-12-25 ENCOUNTER — Encounter: Payer: Self-pay | Admitting: Neurology

## 2020-12-25 VITALS — BP 138/76 | HR 100 | Ht 66.0 in | Wt 164.0 lb

## 2020-12-25 DIAGNOSIS — R413 Other amnesia: Secondary | ICD-10-CM | POA: Diagnosis not present

## 2020-12-25 DIAGNOSIS — Z87820 Personal history of traumatic brain injury: Secondary | ICD-10-CM

## 2020-12-25 DIAGNOSIS — R4189 Other symptoms and signs involving cognitive functions and awareness: Secondary | ICD-10-CM | POA: Diagnosis not present

## 2020-12-25 DIAGNOSIS — F0781 Postconcussional syndrome: Secondary | ICD-10-CM | POA: Diagnosis not present

## 2020-12-25 DIAGNOSIS — Z820 Family history of epilepsy and other diseases of the nervous system: Secondary | ICD-10-CM

## 2020-12-25 NOTE — Patient Instructions (Signed)
Blood work Formal Neurocognitive testing with Dr. Kieth Brightly  Memory Compensation Strategies  1. Use "WARM" strategy.  W= write it down  A= associate it  R= repeat it  M= make a mental note  2.   You can keep a Glass blower/designer.  Use a 3-ring notebook with sections for the following: calendar, important names and phone numbers,  medications, doctors' names/phone numbers, lists/reminders, and a section to journal what you did  each day.   3.    Use a calendar to write appointments down.  4.    Write yourself a schedule for the day.  This can be placed on the calendar or in a separate section of the Memory Notebook.  Keeping a  regular schedule can help memory.  5.    Use medication organizer with sections for each day or morning/evening pills.  You may need help loading it  6.    Keep a basket, or pegboard by the door.  Place items that you need to take out with you in the basket or on the pegboard.  You may also want to  include a message board for reminders.  7.    Use sticky notes.  Place sticky notes with reminders in a place where the task is performed.  For example: " turn off the  stove" placed by the stove, "lock the door" placed on the door at eye level, " take your medications" on  the bathroom mirror or by the place where you normally take your medications.  8.    Use alarms/timers.  Use while cooking to remind yourself to check on food or as a reminder to take your medicine, or as a  reminder to make a call, or as a reminder to perform another task, etc.

## 2021-01-02 LAB — HOMOCYSTEINE: Homocysteine: 3.5 umol/L (ref 0.0–14.5)

## 2021-01-02 LAB — METHYLMALONIC ACID, SERUM: Methylmalonic Acid: 83 nmol/L (ref 0–378)

## 2021-01-02 LAB — TSH: TSH: 1 u[IU]/mL (ref 0.450–4.500)

## 2021-01-02 LAB — B12 AND FOLATE PANEL
Folate: 9 ng/mL (ref >3.0)
Vitamin B-12: 637 pg/mL (ref 232–1245)

## 2021-01-02 LAB — RPR: RPR Ser Ql: NONREACTIVE

## 2021-01-02 LAB — VITAMIN B1: Thiamine: 121.2 nmol/L (ref 66.5–200.0)

## 2021-01-03 ENCOUNTER — Encounter: Payer: Self-pay | Admitting: Psychology

## 2021-04-19 ENCOUNTER — Encounter: Payer: Self-pay | Admitting: Psychology

## 2021-04-19 ENCOUNTER — Encounter: Payer: 59 | Attending: Psychology | Admitting: Psychology

## 2021-04-19 ENCOUNTER — Other Ambulatory Visit: Payer: Self-pay

## 2021-04-19 DIAGNOSIS — Z87820 Personal history of traumatic brain injury: Secondary | ICD-10-CM | POA: Insufficient documentation

## 2021-04-19 DIAGNOSIS — R413 Other amnesia: Secondary | ICD-10-CM | POA: Diagnosis not present

## 2021-04-19 DIAGNOSIS — G44321 Chronic post-traumatic headache, intractable: Secondary | ICD-10-CM | POA: Insufficient documentation

## 2021-04-19 DIAGNOSIS — F0781 Postconcussional syndrome: Secondary | ICD-10-CM | POA: Diagnosis not present
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
## 2021-04-25 ENCOUNTER — Other Ambulatory Visit: Payer: Self-pay

## 2021-04-25 DIAGNOSIS — F0781 Postconcussional syndrome: Secondary | ICD-10-CM

## 2021-05-18 ENCOUNTER — Encounter: Payer: 59 | Admitting: Psychology

## 2021-05-22 NOTE — Progress Notes (Signed)
Neuropsychology Note  Shannon Estes completed 240 minutes of neuropsychological testing with technician, Marica Otter, BA, under the supervision of Arley Phenix, PsyD., Clinical Neuropsychologist. The patient did not appear overtly distressed by the testing session, per behavioral observation or via self-report to the technician. Rest breaks were offered.   Clinical Decision Making: In considering the patient's current level of functioning, level of presumed impairment, nature of symptoms, emotional and behavioral responses during clinical interview, level of literacy, and observed level of motivation/effort, a battery of tests was selected by Dr. Kieth Brightly during initial consultation on 04/19/21 . This was communicated to the technician. Communication between the neuropsychologist and technician was ongoing throughout the testing session and changes were made as deemed necessary based on patient performance on testing, technician observations and additional pertinent factors such as those listed above.  Tests Administered: . Wechsler Memory Scale, 4th Edition (WMS-IV); Adult Battery  . Wechsler Adult Intelligence Scale. 4th Edition (WAIS-IV)  Results:   Composite Score Summary  Scale Sum of Scaled Scores Composite Score Percentile Rank 95% Conf. Interval Qualitative Description  Verbal Comprehension 29 VCI 98 45 92-104 Average  Perceptual Reasoning 25 PRI 90 25 84-97 Average  Working Memory 19 WMI 97 42 90-104 Average  Processing Speed 23 PSI 108 70 99-116 Average  Full Scale 96 FSIQ 97 42 93-101 Average  General Ability 54 GAI 94 34 89-99 Average     Verbal Comprehension Subtests Summary  Subtest Raw Score Scaled Score Percentile Rank Reference Group Scaled Score SEM  Similarities 26 10 50 10 1.08  Vocabulary 35 9 37 10 0.73  (Comprehension) 26 11 63 11 1.08        Perceptual Reasoning Subtests Summary  Subtest Raw Score Scaled Score Percentile Rank Reference Group  Scaled Score SEM  Block Design 29 8 25 7  1.04  Matrix Reasoning 14 9 37 7 0.95  Visual Puzzles 10 8 25 7  0.99  (Figure Weights) 7 6 9 5  0.99  (Picture Completion) 5 5 5 4  1.12        Working Raw Score Scaled Score Percentile Rank Reference Group Scaled Score SEM  Digit Span 30 12 75 11 0.85  Arithmetic 11 7 16 8  1.04  (Letter-Number Seq.) 18 9 37 8 1.08        Processing Speed Subtests Summary  Subtest Raw Score Scaled Score Percentile Rank Reference Group Scaled Score SEM  Symbol Search 30 11 63 9 1.31  Coding 71 12 75 10 0.99  (Cancellation) 33 9 37 7 1.34         Index Score Summary  Index Sum of Scaled Scores Index Score Percentile Rank 95% Confidence Interval Qualitative Descriptor  Auditory Memory (AMI) 29 84 14 79-91 Low Average  Visual Memory (VMI) 41 101 53 95-107 Average  Visual Working Memory (VWMI) 17 91 27 84-99 Average  Immediate Memory (IMI) 32 86 18 80-93 Low Average  Delayed Memory (DMI) 38 96 39 89-103 Average    Primary Subtest Scaled Score Summary  Subtest Domain Raw Score Scaled Score Percentile Rank  Logical Memory I AM 17 6 9   Logical Memory II AM 13 7 16   Verbal Paired Associates I AM 19 7 16   Verbal Paired Associates II AM 8 9 37  Designs I VM 54 8 25  Designs II VM 56 11 63  Visual Reproduction I VM 36 11 63  Visual Reproduction II VM 27 11 63  Spatial Addition VWM 9  8 25  Symbol Span VWM 19 9 37    Auditory Memory Process Score Summary  Process Score Raw Score Scaled Score Percentile Rank Cumulative Percentage (Base Rate)  LM II Recognition 24 - - 26-50%  VPA II Recognition 39 - - 51-75%        Visual Memory Process Score Summary  Process Score Raw Score Scaled Score Percentile Rank Cumulative Percentage (Base Rate)  DE I Content 31 8 25  -  DE I Spatial 13 8 25  -  DE II Content 33 10 50 -  DE II Spatial 15 13 84 -  DE II Recognition 19 - - >75%  VR II Recognition 6 - - 51-75%       ABILITY-MEMORY ANALYSIS  Ability Score:  VCI: 98 Date of Testing:  WAIS-IV; WMS-IV 2021/04/25  Predicted Difference Method   Index Predicted WMS-IV Index Score Actual WMS-IV Index Score Difference Critical Value  Significant Difference Y/N Base Rate  Auditory Memory 99 84 15 9.00 Y 10-15%  Visual Memory 99 101 -2 8.38 N   Visual Working Memory 99 91 8 10.86 N   Immediate Memory 99 86 13 10.12 Y 15%  Delayed Memory 99 96 3 9.95 N   Statistical significance (critical value) at the .01 level.    Feedback to Patient: Shannon Estes will return for an interactive feedback session with Dr. 2021/04/27 at which time her test performances, clinical impressions and treatment recommendations will be reviewed in detail. The patient understands she can contact our office should she require our assistance before this time.  240 minutes spent face-to-face with patient administering standardized tests, 30 minutes spent scoring Walker Kehr). [CPT Kieth Brightly, [id number]]  Full report to follow.

## 2021-06-05 ENCOUNTER — Encounter: Payer: 59 | Attending: Psychology

## 2021-06-05 ENCOUNTER — Other Ambulatory Visit: Payer: Self-pay

## 2021-06-05 DIAGNOSIS — F0781 Postconcussional syndrome: Secondary | ICD-10-CM | POA: Insufficient documentation

## 2021-06-05 NOTE — Progress Notes (Signed)
   Behavioral Observations  The patient was well groomed and appropriately dressed for the testing session. Her manners were appropriate to the situation and she demonstrated a positive attitude toward testing. During the testing session the patient appeared upset and reported that she was trying not to cry and that her anxiety was elevating.    Neuropsychology Note  Shannon Estes completed 90 minutes of neuropsychological testing with technician, Marica Otter, BA, under the supervision of Arley Phenix, PsyD., Clinical Neuropsychologist. The patient did not appear overtly distressed by the testing session, per behavioral observation or via self-report to the technician. Rest breaks were offered.   Clinical Decision Making: In considering the patient's current level of functioning, level of presumed impairment, nature of symptoms, emotional and behavioral responses during clinical interview, level of literacy, and observed level of motivation/effort, a battery of tests was selected by Dr. Kieth Brightly during initial consultation on 04/19/21. This was communicated to the technician. Communication between the neuropsychologist and technician was ongoing throughout the testing session and changes were made as deemed necessary based on patient performance on testing, technician observations and additional pertinent factors such as those listed above.  Tests Administered: Comprehensive Attention Battery (CAB) Continuous Performance Test (CPT)  Results: Will be included in final report   Feedback to Patient: Shannon Estes will return for an interactive feedback session with Dr. Kieth Brightly at which time her test performances, clinical impressions and treatment recommendations will be reviewed in detail. The patient understands she can contact our office should she require our assistance before this time.  90 minutes spent face-to-face with patient administering standardized tests, 30 minutes spent  scoring Radiographer, therapeutic). [CPT [id number], [id number]]  Full report to follow.

## 2021-06-26 ENCOUNTER — Encounter: Payer: 59 | Attending: Psychology | Admitting: Psychology

## 2021-06-26 ENCOUNTER — Other Ambulatory Visit: Payer: Self-pay

## 2021-06-26 ENCOUNTER — Encounter: Payer: Self-pay | Admitting: Psychology

## 2021-06-26 DIAGNOSIS — F4312 Post-traumatic stress disorder, chronic: Secondary | ICD-10-CM | POA: Diagnosis present

## 2021-06-26 DIAGNOSIS — R413 Other amnesia: Secondary | ICD-10-CM

## 2021-06-26 DIAGNOSIS — F0781 Postconcussional syndrome: Secondary | ICD-10-CM | POA: Insufficient documentation

## 2021-06-26 DIAGNOSIS — F4310 Post-traumatic stress disorder, unspecified: Secondary | ICD-10-CM | POA: Insufficient documentation
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
## 2021-07-09 ENCOUNTER — Encounter (HOSPITAL_BASED_OUTPATIENT_CLINIC_OR_DEPARTMENT_OTHER): Payer: 59 | Admitting: Psychology

## 2021-07-09 ENCOUNTER — Other Ambulatory Visit: Payer: Self-pay

## 2021-07-09 DIAGNOSIS — F0781 Postconcussional syndrome: Secondary | ICD-10-CM | POA: Diagnosis not present

## 2021-07-09 DIAGNOSIS — F4312 Post-traumatic stress disorder, chronic: Secondary | ICD-10-CM

## 2021-07-09 DIAGNOSIS — R413 Other amnesia: Secondary | ICD-10-CM

## 2021-07-09 DIAGNOSIS — F4310 Post-traumatic stress disorder, unspecified: Secondary | ICD-10-CM | POA: Diagnosis not present

## 2021-07-11 ENCOUNTER — Ambulatory Visit: Payer: 59 | Admitting: Psychology
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
## 2021-09-06 ENCOUNTER — Other Ambulatory Visit: Payer: Self-pay

## 2021-09-06 ENCOUNTER — Encounter: Payer: Self-pay | Admitting: Psychology

## 2021-09-06 ENCOUNTER — Encounter: Payer: 59 | Attending: Psychology | Admitting: Psychology

## 2021-09-06 DIAGNOSIS — F0781 Postconcussional syndrome: Secondary | ICD-10-CM | POA: Insufficient documentation

## 2021-09-06 DIAGNOSIS — F4312 Post-traumatic stress disorder, chronic: Secondary | ICD-10-CM | POA: Insufficient documentation

## 2021-09-06 DIAGNOSIS — R413 Other amnesia: Secondary | ICD-10-CM | POA: Diagnosis not present

## 2021-09-06 DIAGNOSIS — F4310 Post-traumatic stress disorder, unspecified: Secondary | ICD-10-CM | POA: Insufficient documentation
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
## 2021-09-19 ENCOUNTER — Ambulatory Visit: Payer: 59 | Admitting: Psychology

## 2021-09-24 ENCOUNTER — Ambulatory Visit: Payer: 59 | Admitting: Psychology

## 2022-02-28 ENCOUNTER — Other Ambulatory Visit: Payer: Self-pay

## 2022-02-28 ENCOUNTER — Encounter: Payer: 59 | Attending: Psychology | Admitting: Psychology

## 2022-02-28 DIAGNOSIS — F0781 Postconcussional syndrome: Secondary | ICD-10-CM | POA: Diagnosis not present

## 2022-02-28 DIAGNOSIS — F4312 Post-traumatic stress disorder, chronic: Secondary | ICD-10-CM | POA: Diagnosis not present

## 2022-02-28 DIAGNOSIS — R413 Other amnesia: Secondary | ICD-10-CM | POA: Insufficient documentation
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
## 2022-05-17 ENCOUNTER — Other Ambulatory Visit: Payer: Self-pay | Admitting: Family Medicine

## 2022-05-30 ENCOUNTER — Encounter: Payer: 59 | Attending: Psychology | Admitting: Psychology

## 2022-05-30 ENCOUNTER — Encounter: Payer: Self-pay | Admitting: Psychology

## 2022-05-30 DIAGNOSIS — F0781 Postconcussional syndrome: Secondary | ICD-10-CM | POA: Diagnosis not present

## 2022-05-30 DIAGNOSIS — R413 Other amnesia: Secondary | ICD-10-CM | POA: Diagnosis not present

## 2022-05-30 DIAGNOSIS — F4312 Post-traumatic stress disorder, chronic: Secondary | ICD-10-CM | POA: Insufficient documentation
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
## 2022-08-22 ENCOUNTER — Other Ambulatory Visit: Payer: Self-pay | Admitting: Family Medicine

## 2022-08-22 DIAGNOSIS — Z1231 Encounter for screening mammogram for malignant neoplasm of breast: Secondary | ICD-10-CM

## 2022-09-06 ENCOUNTER — Ambulatory Visit
Admission: RE | Admit: 2022-09-06 | Discharge: 2022-09-06 | Disposition: A | Discharge status: Home / Self Care | Payer: Commercial Managed Care - HMO | Source: Ambulatory Visit | Attending: Family Medicine | Admitting: Family Medicine

## 2022-09-06 DIAGNOSIS — Z1231 Encounter for screening mammogram for malignant neoplasm of breast: Secondary | ICD-10-CM

## 2022-09-10 ENCOUNTER — Other Ambulatory Visit: Payer: Self-pay | Admitting: Family Medicine

## 2022-09-10 DIAGNOSIS — R928 Other abnormal and inconclusive findings on diagnostic imaging of breast: Secondary | ICD-10-CM

## 2022-09-18 ENCOUNTER — Other Ambulatory Visit: Payer: Self-pay | Admitting: Family Medicine

## 2022-09-18 ENCOUNTER — Ambulatory Visit
Admission: RE | Admit: 2022-09-18 | Discharge: 2022-09-18 | Disposition: A | Discharge status: Home / Self Care | Payer: Commercial Managed Care - HMO | Source: Ambulatory Visit | Attending: Family Medicine | Admitting: Family Medicine

## 2022-09-18 DIAGNOSIS — N6310 Unspecified lump in the right breast, unspecified quadrant: Secondary | ICD-10-CM

## 2022-09-18 DIAGNOSIS — R928 Other abnormal and inconclusive findings on diagnostic imaging of breast: Secondary | ICD-10-CM

## 2022-09-27 ENCOUNTER — Ambulatory Visit
Admission: RE | Admit: 2022-09-27 | Discharge: 2022-09-27 | Disposition: A | Discharge status: Home / Self Care | Payer: Commercial Managed Care - HMO | Source: Ambulatory Visit | Attending: Family Medicine | Admitting: Family Medicine

## 2022-09-27 DIAGNOSIS — N6310 Unspecified lump in the right breast, unspecified quadrant: Secondary | ICD-10-CM

## 2022-10-20 ENCOUNTER — Other Ambulatory Visit: Payer: Self-pay | Admitting: General Surgery

## 2022-10-20 DIAGNOSIS — R928 Other abnormal and inconclusive findings on diagnostic imaging of breast: Secondary | ICD-10-CM

## 2022-10-21 ENCOUNTER — Other Ambulatory Visit: Payer: Self-pay | Admitting: General Surgery

## 2022-10-25 ENCOUNTER — Other Ambulatory Visit: Payer: Self-pay | Admitting: General Surgery

## 2022-10-25 DIAGNOSIS — R928 Other abnormal and inconclusive findings on diagnostic imaging of breast: Secondary | ICD-10-CM

## 2022-10-31 NOTE — Progress Notes (Signed)
Cardiology Office Note   Date:  11/01/2022   ID:  Shannon Estes, DOB Apr 29, 1961, MRN [id number]  PCP:  Shannon Small, MD  Cardiologist:   None Referring:  Shannon Small, MD  Chief Complaint  Patient presents with   Chest Pain      History of Present Illness: Shannon Estes is a 61 y.o. female who presents for evaluation of chest pain.   The patient has not had cardiac history.  However, she has had chest discomfort.  This has been going on for years but has been ascribed to reflux.  She thinks this is different.  She says this is not related to food.  It happens sporadically.  It is a right upper or left upper chest discomfort.  It is a pressure that can be 8/10 in intensity.  It comes on at rest.  She gets a little short of breath with it.  It goes away spontaneously after 10 minutes.  She can do activities such as her household chores and walking up and down stairs without bringing on this discomfort.  She does not describe associated nausea vomiting or diaphoresis.  There is no radiation to her jaw or to her arms.  She has not had any prior cardiac history or work-up although I do see CT of her chest in 2019 that demonstrated no atherosclerosis or coronary calcium.   Past Medical History:  Diagnosis Date   Anemia    Depression    Diabetes mellitus without complication (HCC)    pre-diabetes   GERD (gastroesophageal reflux disease)    Headache    Low vitamin D level    Memory change    Reflux     Past Surgical History:  Procedure Laterality Date   ARTERY BIOPSY Left 10/12/2020   Procedure: BIOPSY TEMPORAL ARTERY;  Surgeon: [name], MD;  Location: [facility];  Service: General;  Laterality: Left;   CESAREAN SECTION     2 times   COLONOSCOPY WITH ESOPHAGOGASTRODUODENOSCOPY (EGD)       Current Outpatient Medications  Medication Sig Dispense Refill   aspirin EC 81 MG tablet Take 81 mg by mouth daily.     ergocalciferol (VITAMIN D2) 1.25  MG (50000 UT) capsule Take 50,000 Units by mouth once a week.     lansoprazole (PREVACID) 30 MG capsule TAKE ONE CAPSULE BY MOUTH daily BEFORE a meal     Polysaccharide Iron Complex (POLY-IRON 150 PO) Take by mouth.     rosuvastatin (CRESTOR) 5 MG tablet Take 5 mg by mouth daily.     No current facility-administered medications for this visit.    Allergies:   Trazodone and nefazodone and Latex    Social History:  The patient  reports that she quit smoking about 40 years ago. Her smoking use included cigarettes. She has never used smokeless tobacco. She reports current alcohol use of about 7.0 - 14.0 standard drinks of alcohol per week. She reports that she does not use drugs.   Family History:  The patient's family history includes Alzheimer's disease in her mother; Arthritis in her father; Breast cancer in her mother; Colon polyps in her father; Diabetes in her mother; Healthy in her son; Kidney disease in her mother; Lung cancer in her father; Migraines in her daughter; Other in her brother.    ROS:  Please see the history of present illness.   Otherwise, review of systems are positive for none.   All other systems are reviewed  and negative.    PHYSICAL EXAM: VS:  BP 134/88   Pulse 78   Ht 5\' 6"  (1.676 m)   Wt 167 lb 12.8 oz (76.1 kg)   SpO2 99%   BMI 27.08 kg/m  , BMI Body mass index is 27.08 kg/m. GENERAL:  Well appearing HEENT:  Pupils equal round and reactive, fundi not visualized, oral mucosa unremarkable NECK:  No jugular venous distention, waveform within normal limits, carotid upstroke brisk and symmetric, no bruits, no thyromegaly LYMPHATICS:  No cervical, inguinal adenopathy LUNGS:  Clear to auscultation bilaterally BACK:  No CVA tenderness CHEST:  Unremarkable HEART:  PMI not displaced or sustained,S1 and S2 within normal limits, no S3, no S4, no clicks, no rubs, no murmurs ABD:  Flat, positive bowel sounds normal in frequency in pitch, no bruits, no rebound, no  guarding, no midline pulsatile mass, no hepatomegaly, no splenomegaly EXT:  2 plus pulses throughout, no edema, no cyanosis no clubbing SKIN:  No rashes no nodules NEURO:  Cranial nerves II through XII grossly intact, motor grossly intact throughout PSYCH:  Cognitively intact, oriented to person place and time    EKG:  EKG is ordered today. The ekg ordered today demonstrates sinus rhythm, rate 78, axis within normal limits, intervals within normal limits, no acute ST-T wave changes.   Recent Labs: No results found for requested labs within last 365 days.    Lipid Panel No results found for: "CHOL", "TRIG", "HDL", "CHOLHDL", "VLDL", "LDLCALC", "LDLDIRECT"    Wt Readings from Last 3 Encounters:  11/01/22 167 lb 12.8 oz (76.1 kg)  12/25/20 164 lb (74.4 kg)  10/12/20 169 lb 12.1 oz (77 kg)      Other studies Reviewed: Additional studies/ records that were reviewed today include: Labs. Review of the above records demonstrates:  Please see elsewhere in the note.     ASSESSMENT AND PLAN:  Chest pain:   Her chest discomfort has nonanginal greater than anginal features.  The pretest probability of obstructive coronary disease is low. I will bring the patient back for a POET (Plain Old Exercise Test). This will allow me to screen for obstructive coronary disease, risk stratify and very importantly provide a prescription for exercise.  Hypertension: Blood pressures well controlled.  No change in therapy.  DM: I do not have a recent A1c in suggested she follow-up with her primary provider for follow-up of this.  Dyslipidemia: LDL of 80 at 98 but her HDL is 88.  No change in therapy.   Current medicines are reviewed at length with the patient today.  The patient does not have concerns regarding medicines.  The following changes have been made:  no change  Labs/ tests ordered today include:   Orders Placed This Encounter  Procedures   Exercise Tolerance Test   EKG 12-Lead      Disposition:   FU with me as needed based on the results of the above testing.   Signed, 10/14/20, MD  11/01/2022 12:09 PM    [facility]

## 2022-11-01 ENCOUNTER — Encounter: Payer: Self-pay | Admitting: Cardiology

## 2022-11-01 ENCOUNTER — Ambulatory Visit: Payer: Commercial Managed Care - HMO | Attending: Cardiology | Admitting: Cardiology

## 2022-11-01 VITALS — BP 134/88 | HR 78 | Ht 66.0 in | Wt 167.8 lb

## 2022-11-01 DIAGNOSIS — R079 Chest pain, unspecified: Secondary | ICD-10-CM | POA: Diagnosis not present

## 2022-11-01 NOTE — Patient Instructions (Signed)
Medication Instructions:  Your physician recommends that you continue on your current medications as directed. Please refer to the Current Medication list given to you today.   *If you need a refill on your cardiac medications before your next appointment, please call your pharmacy*   Lab Work: NONE ordered at this time of appointment   If you have labs (blood work) drawn today and your tests are completely normal, you will receive your results only by: MyChart Message (if you have MyChart) OR A paper copy in the mail If you have any lab test that is abnormal or we need to change your treatment, we will call you to review the results.   Testing/Procedures: Your physician has requested that you have an exercise tolerance test. For further information please visit [email or web address] Please also follow instruction sheet, as given.   Follow-Up: At [facility], you and your health needs are our priority.  As part of our continuing mission to provide you with exceptional heart care, we have created designated Provider Care Teams.  These Care Teams include your primary Cardiologist (physician) and Advanced Practice Providers (APPs -  Physician Assistants and Nurse Practitioners) who all work together to provide you with the care you need, when you need it.  We recommend signing up for the patient portal called "MyChart".  Sign up information is provided on this After Visit Summary.  MyChart is used to connect with patients for Virtual Visits (Telemedicine).  Patients are able to view lab/test results, encounter notes, upcoming appointments, etc.  Non-urgent messages can be sent to your provider as well.   To learn more about what you can do with MyChart, go to [email or web address].    Your next appointment:    As Needed   The format for your next appointment:   In Person  Provider:   Dr. Antoine Poche     Other Instructions   Important Information About Sugar

## 2022-11-14 ENCOUNTER — Ambulatory Visit: Payer: Commercial Managed Care - HMO | Attending: Cardiology

## 2022-11-14 DIAGNOSIS — R079 Chest pain, unspecified: Secondary | ICD-10-CM

## 2022-11-16 LAB — EXERCISE TOLERANCE TEST
Angina Index: 0
Base ST Depression (mm): 0 mm
Duke Treadmill Score: 10
Estimated workload: 11.7
Exercise duration (min): 10 min
Exercise duration (sec): 0 s
MPHR: 160 {beats}/min
Peak HR: 164 {beats}/min
Percent HR: 102 %
RPE: 15
Rest HR: 84 {beats}/min
ST Depression (mm): 0 mm

## 2022-11-26 NOTE — Pre-Procedure Instructions (Signed)
Surgical Instructions    Your procedure is scheduled on Tuesday, December 19th.  Report to [facility] Main Entrance "A" at 07:00 A.M., then check in with the Admitting office.  Call this number if you have problems the morning of surgery:  [phone]   If you have any questions prior to your surgery date call [phone]: Open Monday-Friday 8am-4pm    Remember:  Do not eat after midnight the night before your surgery  You may drink clear liquids until 06:00 AM the morning of your surgery.   Clear liquids allowed are: Water, Non-Citrus Juices (without pulp), Carbonated Beverages, Clear Tea, Black Coffee Only (NO MILK, CREAM OR POWDERED CREAMER of any kind), and Gatorade.   Patient Instructions  The night before surgery:  No food after midnight. ONLY clear liquids after midnight    The day of surgery (if you have diabetes): Drink ONE (1) 12 oz G2 given to you in your pre admission testing appointment by 06:00 AM the morning of surgery. Drink in one sitting. Do not sip.  This drink was given to you during your hospital  pre-op appointment visit.  Nothing else to drink after completing the  12 oz bottle of G2.         If you have questions, please contact your surgeon's office.     Take these medicines the morning of surgery with A SIP OF WATER  lansoprazole (PREVACID)  rosuvastatin (CRESTOR)    If needed: Brimonidine Tartrate (LUMIFY) eye drops   Follow your surgeon's instructions on when to stop Aspirin.  If no instructions were given by your surgeon then you will need to call the office to get those instructions.     As of today, STOP taking any Aleve, Naproxen, Ibuprofen, Motrin, Advil, Goody's, BC's, all herbal medications, fish oil, and all vitamins.                     Do NOT Smoke (Tobacco/Vaping) for 24 hours prior to your procedure.  If you use a CPAP at night, you may bring your mask/headgear for your overnight stay.   Contacts, glasses, piercing's,  hearing aid's, dentures or partials may not be worn into surgery, please bring cases for these belongings.    For patients admitted to the hospital, discharge time will be determined by your treatment team.   Patients discharged the day of surgery will not be allowed to drive home, and someone needs to stay with them for 24 hours.  SURGICAL WAITING ROOM VISITATION Patients having surgery or a procedure may have no more than 2 support people in the waiting area - these visitors may rotate.   Children under the age of 21 must have an adult with them who is not the patient. If the patient needs to stay at the hospital during part of their recovery, the visitor guidelines for inpatient rooms apply. Pre-op nurse will coordinate an appropriate time for 1 support person to accompany patient in pre-op.  This support person may not rotate.   Please refer to the [facility] website for the visitor guidelines for Inpatients (after your surgery is over and you are in a regular room).    Special instructions:   [facility]- Preparing For Surgery  Before surgery, you can play an important role. Because skin is not sterile, your skin needs to be as free of germs as possible. You can reduce the number of germs on your skin by washing with CHG (chlorahexidine gluconate) Soap before surgery.  CHG is an antiseptic cleaner which kills germs and bonds with the skin to continue killing germs even after washing.    Oral Hygiene is also important to reduce your risk of infection.  Remember - BRUSH YOUR TEETH THE MORNING OF SURGERY WITH YOUR REGULAR TOOTHPASTE  Please do not use if you have an allergy to CHG or antibacterial soaps. If your skin becomes reddened/irritated stop using the CHG.  Do not shave (including legs and underarms) for at least 48 hours prior to first CHG shower. It is OK to shave your face.  Please follow these instructions carefully.   Shower the NIGHT BEFORE SURGERY and the MORNING OF  SURGERY  If you chose to wash your hair, wash your hair first as usual with your normal shampoo.  After you shampoo, rinse your hair and body thoroughly to remove the shampoo.  Use CHG Soap as you would any other liquid soap. You can apply CHG directly to the skin and wash gently with a scrungie or a clean washcloth.   Apply the CHG Soap to your body ONLY FROM THE NECK DOWN.  Do not use on open wounds or open sores. Avoid contact with your eyes, ears, mouth and genitals (private parts). Wash Face and genitals (private parts)  with your normal soap.   Wash thoroughly, paying special attention to the area where your surgery will be performed.  Thoroughly rinse your body with warm water from the neck down.  DO NOT shower/wash with your normal soap after using and rinsing off the CHG Soap.  Pat yourself dry with a CLEAN TOWEL.  Wear CLEAN PAJAMAS to bed the night before surgery  Place CLEAN SHEETS on your bed the night before your surgery  DO NOT SLEEP WITH PETS.   Day of Surgery: Take a shower with CHG soap. Do not wear jewelry or makeup Do not wear lotions, powders, perfumes, or deodorant. Do not shave 48 hours prior to surgery.   Do not bring valuables to the hospital. [facility] is not responsible for any belongings or valuables. Do not wear nail polish, gel polish, artificial nails, or any other type of covering on natural nails (fingers and toes) If you have artificial nails or gel coating that need to be removed by a nail salon, please have this removed prior to surgery. Artificial nails or gel coating may interfere with anesthesia's ability to adequately monitor your vital signs. Wear Clean/Comfortable clothing the morning of surgery Remember to brush your teeth WITH YOUR REGULAR TOOTHPASTE.   Please read over the following fact sheets that you were given.    If you received a COVID test during your pre-op visit  it is requested that you wear a mask when out in public,  stay away from anyone that may not be feeling well and notify your surgeon if you develop symptoms. If you have been in contact with anyone that has tested positive in the last 10 days please notify you surgeon.

## 2022-11-27 ENCOUNTER — Other Ambulatory Visit: Payer: Self-pay

## 2022-11-27 ENCOUNTER — Encounter (HOSPITAL_COMMUNITY): Payer: Self-pay

## 2022-11-27 ENCOUNTER — Encounter (HOSPITAL_COMMUNITY)
Admission: RE | Admit: 2022-11-27 | Discharge: 2022-11-27 | Disposition: A | Discharge status: Home / Self Care | Payer: Commercial Managed Care - HMO | Source: Ambulatory Visit | Attending: General Surgery | Admitting: General Surgery

## 2022-11-27 DIAGNOSIS — E785 Hyperlipidemia, unspecified: Secondary | ICD-10-CM | POA: Insufficient documentation

## 2022-11-27 DIAGNOSIS — Z87891 Personal history of nicotine dependence: Secondary | ICD-10-CM | POA: Diagnosis not present

## 2022-11-27 DIAGNOSIS — R928 Other abnormal and inconclusive findings on diagnostic imaging of breast: Secondary | ICD-10-CM | POA: Diagnosis not present

## 2022-11-27 DIAGNOSIS — R7303 Prediabetes: Secondary | ICD-10-CM | POA: Diagnosis not present

## 2022-11-27 DIAGNOSIS — Z01818 Encounter for other preprocedural examination: Secondary | ICD-10-CM | POA: Insufficient documentation

## 2022-11-27 DIAGNOSIS — K219 Gastro-esophageal reflux disease without esophagitis: Secondary | ICD-10-CM | POA: Insufficient documentation

## 2022-11-27 DIAGNOSIS — F4310 Post-traumatic stress disorder, unspecified: Secondary | ICD-10-CM | POA: Diagnosis not present

## 2022-11-27 HISTORY — DX: Personal history of other diseases of the digestive system: Z87.19

## 2022-11-27 HISTORY — DX: Hyperlipidemia, unspecified: E78.5

## 2022-11-27 HISTORY — DX: Post-traumatic stress disorder, unspecified: F43.10

## 2022-11-27 NOTE — Progress Notes (Signed)
PCP - Dr. Maurice Small Cardiologist - no, pt saw Dr. Antoine Poche on 11/17 due to chest pain. Pt states cardiac issues were ruled out  PPM/ICD - denies   Chest x-ray - denies EKG - 11/01/22 Stress Test - 11/14/22 ECHO - denies Cardiac Cath - denies  Sleep Study - denies   DM- pre-diabetic, does not check CBG  Last dose of GLP1 agonist-  n/a   Blood Thinner Instructions: n/a Aspirin Instructions: f/u with surgeon  ERAS Protcol - yes PRE-SURGERY G2- given at PAT  COVID TEST- n/a   Anesthesia review: yes, pt saw Dr. Antoine Poche due to chest pain.  Patient denies shortness of breath, fever, cough and chest pain at PAT appointment   All instructions explained to the patient, with a verbal understanding of the material. Patient agrees to go over the instructions while at home for a better understanding. The opportunity to ask questions was provided.

## 2022-11-28 NOTE — Anesthesia Preprocedure Evaluation (Addendum)
Anesthesia Evaluation  Patient identified by MRN, date of birth, ID band Patient awake    Reviewed: Allergy & Precautions, NPO status , Patient's Chart, lab work & pertinent test results  History of Anesthesia Complications Negative for: history of anesthetic complications  Airway Mallampati: II  TM Distance: >3 FB Neck ROM: Full    Dental  (+) Dental Advisory Given   Pulmonary former smoker   Pulmonary exam normal        Cardiovascular negative cardio ROS Normal cardiovascular exam     Neuro/Psych  Headaches PSYCHIATRIC DISORDERS Anxiety Depression       GI/Hepatic Neg liver ROS, hiatal hernia,GERD  Controlled and Medicated,,  Endo/Other  diabetes  Right breast Ca Pre-DM Hyperlipidemia   Renal/GU negative Renal ROS  negative genitourinary   Musculoskeletal negative musculoskeletal ROS (+)    Abdominal   Peds  Hematology  (+) Blood dyscrasia, anemia   Anesthesia Other Findings Covid test negative   Reproductive/Obstetrics                              Anesthesia Physical Anesthesia Plan  ASA: II  Anesthesia Plan: General   Post-op Pain Management:    Induction: Intravenous  PONV Risk Score and Plan: 3 and Treatment may vary due to age or medical condition, Ondansetron and Dexamethasone  Airway Management Planned: LMA  Additional Equipment: None  Intra-op Plan:   Post-operative Plan: Extubation in OR  Informed Consent: I have reviewed the patients History and Physical, chart, labs and discussed the procedure including the risks, benefits and alternatives for the proposed anesthesia with the patient or authorized representative who has indicated his/her understanding and acceptance.     Dental advisory given  Plan Discussed with: CRNA and Anesthesiologist  Anesthesia Plan Comments: (PAT note written 11/28/2022 by Shonna Chock, PA-C.  )         Anesthesia  Quick Evaluation
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
## 2022-12-02 ENCOUNTER — Ambulatory Visit
Admission: RE | Admit: 2022-12-02 | Discharge: 2022-12-02 | Disposition: A | Discharge status: Home / Self Care | Payer: Commercial Managed Care - HMO | Source: Ambulatory Visit | Attending: General Surgery | Admitting: General Surgery

## 2022-12-02 ENCOUNTER — Other Ambulatory Visit: Payer: Self-pay | Admitting: General Surgery

## 2022-12-02 DIAGNOSIS — R928 Other abnormal and inconclusive findings on diagnostic imaging of breast: Secondary | ICD-10-CM

## 2022-12-02 HISTORY — PX: BREAST BIOPSY: SHX20

## 2022-12-03 ENCOUNTER — Ambulatory Visit: Payer: 59 | Admitting: Psychology

## 2022-12-03 ENCOUNTER — Ambulatory Visit
Admission: RE | Admit: 2022-12-03 | Discharge: 2022-12-03 | Disposition: A | Discharge status: Home / Self Care | Payer: Commercial Managed Care - HMO | Source: Ambulatory Visit | Attending: General Surgery | Admitting: General Surgery

## 2022-12-03 ENCOUNTER — Ambulatory Visit (HOSPITAL_COMMUNITY): Payer: Commercial Managed Care - HMO | Admitting: Vascular Surgery

## 2022-12-03 ENCOUNTER — Ambulatory Visit (HOSPITAL_COMMUNITY)
Admission: RE | Admit: 2022-12-03 | Discharge: 2022-12-03 | Disposition: A | Discharge status: Home / Self Care | Payer: Commercial Managed Care - HMO | Attending: General Surgery | Admitting: General Surgery

## 2022-12-03 ENCOUNTER — Other Ambulatory Visit: Payer: Self-pay

## 2022-12-03 ENCOUNTER — Encounter (HOSPITAL_COMMUNITY): Payer: Self-pay | Admitting: General Surgery

## 2022-12-03 ENCOUNTER — Ambulatory Visit (HOSPITAL_BASED_OUTPATIENT_CLINIC_OR_DEPARTMENT_OTHER): Payer: Commercial Managed Care - HMO | Admitting: Certified Registered Nurse Anesthetist

## 2022-12-03 ENCOUNTER — Encounter (HOSPITAL_COMMUNITY)
Admission: RE | Disposition: A | Discharge status: Home / Self Care | Payer: Self-pay | Source: Home / Self Care | Attending: General Surgery

## 2022-12-03 DIAGNOSIS — R928 Other abnormal and inconclusive findings on diagnostic imaging of breast: Secondary | ICD-10-CM | POA: Insufficient documentation

## 2022-12-03 DIAGNOSIS — K219 Gastro-esophageal reflux disease without esophagitis: Secondary | ICD-10-CM | POA: Insufficient documentation

## 2022-12-03 DIAGNOSIS — D649 Anemia, unspecified: Secondary | ICD-10-CM | POA: Insufficient documentation

## 2022-12-03 DIAGNOSIS — R7303 Prediabetes: Secondary | ICD-10-CM | POA: Diagnosis not present

## 2022-12-03 DIAGNOSIS — R079 Chest pain, unspecified: Secondary | ICD-10-CM | POA: Insufficient documentation

## 2022-12-03 DIAGNOSIS — D759 Disease of blood and blood-forming organs, unspecified: Secondary | ICD-10-CM | POA: Insufficient documentation

## 2022-12-03 DIAGNOSIS — E785 Hyperlipidemia, unspecified: Secondary | ICD-10-CM | POA: Diagnosis not present

## 2022-12-03 DIAGNOSIS — Z87891 Personal history of nicotine dependence: Secondary | ICD-10-CM | POA: Diagnosis not present

## 2022-12-03 HISTORY — PX: RADIOACTIVE SEED GUIDED EXCISIONAL BREAST BIOPSY: SHX6490

## 2022-12-03 LAB — GLUCOSE, CAPILLARY: Glucose-Capillary: 111 mg/dL — ABNORMAL HIGH (ref 70–99)

## 2022-12-03 SURGERY — RADIOACTIVE SEED GUIDED BREAST BIOPSY
Anesthesia: General | Site: Breast | Laterality: Right

## 2022-12-03 MED ORDER — FENTANYL CITRATE (PF) 250 MCG/5ML IJ SOLN
INTRAMUSCULAR | Status: AC
  Filled 2022-12-03: qty 5

## 2022-12-03 MED ORDER — LIDOCAINE-EPINEPHRINE 1 %-1:100000 IJ SOLN
INTRAMUSCULAR | Status: DC | PRN
  Administered 2022-12-03: 30 mL via INTRAMUSCULAR

## 2022-12-03 MED ORDER — PROPOFOL 10 MG/ML IV BOLUS
INTRAVENOUS | Status: AC
  Filled 2022-12-03: qty 20

## 2022-12-03 MED ORDER — OXYCODONE HCL 5 MG PO TABS
5.0000 mg | ORAL_TABLET | Freq: Once | ORAL | Status: AC | PRN
  Administered 2022-12-03: 5 mg via ORAL

## 2022-12-03 MED ORDER — ONDANSETRON HCL 4 MG/2ML IJ SOLN: 4.0000 mg | Freq: Once | INTRAMUSCULAR | Status: DC | PRN

## 2022-12-03 MED ORDER — CHLORHEXIDINE GLUCONATE CLOTH 2 % EX PADS: 6.0000 | MEDICATED_PAD | Freq: Once | CUTANEOUS | Status: DC

## 2022-12-03 MED ORDER — OXYCODONE HCL 5 MG/5ML PO SOLN: 5.0000 mg | Freq: Once | ORAL | Status: AC | PRN

## 2022-12-03 MED ORDER — LIDOCAINE-EPINEPHRINE 1 %-1:100000 IJ SOLN
INTRAMUSCULAR | Status: AC
  Filled 2022-12-03: qty 1

## 2022-12-03 MED ORDER — EPHEDRINE SULFATE-NACL 50-0.9 MG/10ML-% IV SOSY
PREFILLED_SYRINGE | INTRAVENOUS | Status: DC | PRN
  Administered 2022-12-03: 5 mg via INTRAVENOUS

## 2022-12-03 MED ORDER — LIDOCAINE 2% (20 MG/ML) 5 ML SYRINGE
INTRAMUSCULAR | Status: AC
  Filled 2022-12-03: qty 5

## 2022-12-03 MED ORDER — FENTANYL CITRATE (PF) 100 MCG/2ML IJ SOLN: 25.0000 ug | INTRAMUSCULAR | Status: DC | PRN

## 2022-12-03 MED ORDER — ORAL CARE MOUTH RINSE: 15.0000 mL | Freq: Once | OROMUCOSAL | Status: AC

## 2022-12-03 MED ORDER — ONDANSETRON HCL 4 MG/2ML IJ SOLN
INTRAMUSCULAR | Status: AC
  Filled 2022-12-03: qty 2

## 2022-12-03 MED ORDER — PHENYLEPHRINE 80 MCG/ML (10ML) SYRINGE FOR IV PUSH (FOR BLOOD PRESSURE SUPPORT)
PREFILLED_SYRINGE | INTRAVENOUS | Status: AC
  Filled 2022-12-03: qty 10

## 2022-12-03 MED ORDER — DEXAMETHASONE SODIUM PHOSPHATE 10 MG/ML IJ SOLN
INTRAMUSCULAR | Status: AC
  Filled 2022-12-03: qty 1

## 2022-12-03 MED ORDER — LACTATED RINGERS IV SOLN: INTRAVENOUS | Status: DC

## 2022-12-03 MED ORDER — CHLORHEXIDINE GLUCONATE 0.12 % MT SOLN
15.0000 mL | Freq: Once | OROMUCOSAL | Status: AC
  Administered 2022-12-03: 15 mL via OROMUCOSAL
  Filled 2022-12-03: qty 15

## 2022-12-03 MED ORDER — OXYCODONE HCL 5 MG PO TABS
ORAL_TABLET | ORAL | Status: AC
  Filled 2022-12-03: qty 1

## 2022-12-03 MED ORDER — CEFAZOLIN SODIUM-DEXTROSE 2-4 GM/100ML-% IV SOLN
2.0000 g | INTRAVENOUS | Status: AC
  Administered 2022-12-03: 2 g via INTRAVENOUS
  Filled 2022-12-03: qty 100

## 2022-12-03 MED ORDER — MIDAZOLAM HCL 2 MG/2ML IJ SOLN
INTRAMUSCULAR | Status: DC | PRN
  Administered 2022-12-03: 2 mg via INTRAVENOUS

## 2022-12-03 MED ORDER — PHENYLEPHRINE 80 MCG/ML (10ML) SYRINGE FOR IV PUSH (FOR BLOOD PRESSURE SUPPORT)
PREFILLED_SYRINGE | INTRAVENOUS | Status: DC | PRN
  Administered 2022-12-03: 80 ug via INTRAVENOUS

## 2022-12-03 MED ORDER — MIDAZOLAM HCL 2 MG/2ML IJ SOLN
INTRAMUSCULAR | Status: AC
  Filled 2022-12-03: qty 2

## 2022-12-03 MED ORDER — OXYCODONE HCL 5 MG PO TABS: 5.0000 mg | ORAL_TABLET | Freq: Four times a day (QID) | ORAL | 0 refills | Status: AC | PRN

## 2022-12-03 MED ORDER — PROPOFOL 10 MG/ML IV BOLUS
INTRAVENOUS | Status: DC | PRN
  Administered 2022-12-03: 180 mg via INTRAVENOUS
  Administered 2022-12-03: 200 mg via INTRAVENOUS

## 2022-12-03 MED ORDER — LIDOCAINE 2% (20 MG/ML) 5 ML SYRINGE
INTRAMUSCULAR | Status: DC | PRN
  Administered 2022-12-03: 60 mg via INTRAVENOUS

## 2022-12-03 MED ORDER — ONDANSETRON HCL 4 MG/2ML IJ SOLN
INTRAMUSCULAR | Status: DC | PRN
  Administered 2022-12-03: 4 mg via INTRAVENOUS

## 2022-12-03 MED ORDER — ACETAMINOPHEN 500 MG PO TABS
1000.0000 mg | ORAL_TABLET | ORAL | Status: AC
  Administered 2022-12-03: 1000 mg via ORAL
  Filled 2022-12-03: qty 2

## 2022-12-03 MED ORDER — DEXAMETHASONE SODIUM PHOSPHATE 10 MG/ML IJ SOLN
INTRAMUSCULAR | Status: DC | PRN
  Administered 2022-12-03: 5 mg via INTRAVENOUS

## 2022-12-03 MED ORDER — FENTANYL CITRATE (PF) 250 MCG/5ML IJ SOLN
INTRAMUSCULAR | Status: DC | PRN
  Administered 2022-12-03 (×3): 25 ug via INTRAVENOUS

## 2022-12-03 MED ORDER — BUPIVACAINE HCL (PF) 0.25 % IJ SOLN
INTRAMUSCULAR | Status: AC
  Filled 2022-12-03: qty 30

## 2022-12-03 SURGICAL SUPPLY — 42 items
BAG COUNTER SPONGE SURGICOUNT (BAG) ×1 IMPLANT
BINDER BREAST LRG (GAUZE/BANDAGES/DRESSINGS) IMPLANT
BINDER BREAST XLRG (GAUZE/BANDAGES/DRESSINGS) IMPLANT
BNDG COHESIVE 4X5 TAN STRL (GAUZE/BANDAGES/DRESSINGS) ×1 IMPLANT
CANISTER SUCT 3000ML PPV (MISCELLANEOUS) ×1 IMPLANT
CHLORAPREP W/TINT 26 (MISCELLANEOUS) ×1 IMPLANT
CLIP TI LARGE 6 (CLIP) ×1 IMPLANT
CLIP TI MEDIUM 24 (CLIP) ×1 IMPLANT
CNTNR URN SCR LID CUP LEK RST (MISCELLANEOUS) IMPLANT
CONT SPEC 4OZ STRL OR WHT (MISCELLANEOUS)
COVER PROBE W GEL 5X96 (DRAPES) ×2 IMPLANT
COVER SURGICAL LIGHT HANDLE (MISCELLANEOUS) ×1 IMPLANT
DERMABOND ADVANCED .7 DNX12 (GAUZE/BANDAGES/DRESSINGS) ×1 IMPLANT
DEVICE DUBIN SPECIMEN MAMMOGRA (MISCELLANEOUS) IMPLANT
DRAPE CHEST BREAST 15X10 FENES (DRAPES) ×1 IMPLANT
DRAPE SURG 17X23 STRL (DRAPES) IMPLANT
ELECT COATED BLADE 2.86 ST (ELECTRODE) ×1 IMPLANT
ELECT REM PT RETURN 9FT ADLT (ELECTROSURGICAL) ×1
ELECTRODE REM PT RTRN 9FT ADLT (ELECTROSURGICAL) ×1 IMPLANT
GLOVE BIO SURGEON STRL SZ 6 (GLOVE) ×1 IMPLANT
GLOVE INDICATOR 6.5 STRL GRN (GLOVE) ×1 IMPLANT
GOWN STRL REUS W/ TWL LRG LVL3 (GOWN DISPOSABLE) ×1 IMPLANT
GOWN STRL REUS W/TWL 2XL LVL3 (GOWN DISPOSABLE) ×1 IMPLANT
GOWN STRL REUS W/TWL LRG LVL3 (GOWN DISPOSABLE) ×1
KIT BASIN OR (CUSTOM PROCEDURE TRAY) ×1 IMPLANT
KIT MARKER MARGIN INK (KITS) ×1 IMPLANT
LIGHT WAVEGUIDE WIDE FLAT (MISCELLANEOUS) IMPLANT
NDL 18GX1X1/2 (RX/OR ONLY) (NEEDLE) IMPLANT
NDL FILTER BLUNT 18X1 1/2 (NEEDLE) IMPLANT
NDL HYPO 25GX1X1/2 BEV (NEEDLE) ×1 IMPLANT
NEEDLE 18GX1X1/2 (RX/OR ONLY) (NEEDLE) IMPLANT
NEEDLE FILTER BLUNT 18X1 1/2 (NEEDLE) IMPLANT
NEEDLE HYPO 25GX1X1/2 BEV (NEEDLE) ×1 IMPLANT
NS IRRIG 1000ML POUR BTL (IV SOLUTION) ×1 IMPLANT
PACK GENERAL/GYN (CUSTOM PROCEDURE TRAY) ×1 IMPLANT
PACK UNIVERSAL I (CUSTOM PROCEDURE TRAY) ×1 IMPLANT
STOCKINETTE IMPERVIOUS 9X36 MD (GAUZE/BANDAGES/DRESSINGS) ×1 IMPLANT
SUT MNCRL AB 4-0 PS2 18 (SUTURE) ×1 IMPLANT
SUT VIC AB 3-0 SH 8-18 (SUTURE) ×1 IMPLANT
SYR CONTROL 10ML LL (SYRINGE) ×1 IMPLANT
TOWEL GREEN STERILE (TOWEL DISPOSABLE) ×1 IMPLANT
TOWEL GREEN STERILE FF (TOWEL DISPOSABLE) ×1 IMPLANT

## 2022-12-03 NOTE — Anesthesia Postprocedure Evaluation (Signed)
Anesthesia Post Note  Patient: Shannon Estes  Procedure(s) Performed: RADIOACTIVE SEED GUIDED EXCISIONAL RIGHT BREAST BIOPSY (Right: Breast)     Patient location during evaluation: PACU Anesthesia Type: General Level of consciousness: awake and alert and oriented Pain management: pain level controlled Vital Signs Assessment: post-procedure vital signs reviewed and stable Respiratory status: spontaneous breathing, nonlabored ventilation and respiratory function stable Cardiovascular status: blood pressure returned to baseline and stable Postop Assessment: no apparent nausea or vomiting Anesthetic complications: no   No notable events documented.  Last Vitals:  Vitals:   12/03/22 1045 12/03/22 1100  BP: (!) 150/69 (!) 158/80  Pulse: 74 64  Resp: 14 15  Temp:  36.5 C  SpO2: 100% 100%    Last Pain:  Vitals:   12/03/22 1100  TempSrc:   PainSc: 3                  Jordis Repetto A.

## 2022-12-03 NOTE — Interval H&P Note (Signed)
History and Physical Interval Note:  12/03/2022 8:48 AM  Shannon Estes  has presented today for surgery, with the diagnosis of ABNORMAL RIGHT MAMMOGRAM.  The various methods of treatment have been discussed with the patient and family. After consideration of risks, benefits and other options for treatment, the patient has consented to  Procedure(s) with comments: RADIOACTIVE SEED GUIDED EXCISIONAL RIGHT BREAST BIOPSY (Right) - 60 MIN ROOM 2 as a surgical intervention.  The patient's history has been reviewed, patient examined, no change in status, stable for surgery.  I have reviewed the patient's chart and labs.  Questions were answered to the patient's satisfaction.     [name]

## 2022-12-03 NOTE — Transfer of Care (Signed)
Immediate Anesthesia Transfer of Care Note  Patient: Shannon Estes  Procedure(s) Performed: RADIOACTIVE SEED GUIDED EXCISIONAL RIGHT BREAST BIOPSY (Right: Breast)  Patient Location: PACU  Anesthesia Type:General  Level of Consciousness: awake, alert , and oriented  Airway & Oxygen Therapy: Patient Spontanous Breathing and Patient connected to face mask oxygen  Post-op Assessment: Report given to RN and Post -op Vital signs reviewed and stable  Post vital signs: Reviewed and stable  Last Vitals:  Vitals Value Taken Time  BP 137/59 12/03/22 1030  Temp    Pulse 85 12/03/22 1035  Resp 17 12/03/22 1035  SpO2 100 % 12/03/22 1035  Vitals shown include unvalidated device data.  Last Pain:  Vitals:   12/03/22 0741  TempSrc:   PainSc: 0-No pain         Complications: No notable events documented.

## 2022-12-03 NOTE — H&P (Signed)
REFERRING PHYSICIAN: Marisa Sprinkles  PROVIDER: Matthias Hughs, MD  Care Team: Patient Care Team: Amil Amen, MD as PCP - General (Family Medicine)  MRN: [id number] DOB: April 14, 1961   Subjective  Chief Complaint: Right breast discordant biopsy   History of Present Illness: Shannon Estes is a 61 y.o. female who is seen today as an office consultation at the request of Dr. Valentina Lucks for evaluation of Right breast discordant biopsy . Pt presents with screening detected abnormality on mammogram. Diagnostic imaging was performed. She was found to have a 4-5 mm mass in the central upper breast at 12 o'clock. This had an echogenic rim. Axilla was negative. Core needle biopsy was performed. This showed granulomatous inflammation which was felt to be discordant.  Pt is known to me as I did a temporal artery biopsy 2 years ago on her.  Patient inquires about how she would know if something is cardiac pain or heartburn. She has a long history of heartburn and is taking multiple medications for this. She has also had endoscopies. Her pain is in the mid chest. She is not sure about the temporal nature of it.  Family cancer history: Patient's mother had breast cancer twice in her 46s and 54s. Brother had some sort of intraabdominal cancer, maternal grandmother had stomach cancer, maternal uncle had prostate cancer. Father had lung cancer.  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  Diagnostic mammogram/us 09/18/2022 ACR Breast Density Category b: There are scattered areas of fibroglandular density.  FINDINGS: Mammogram:  Right breast: Spot compression tomosynthesis views of the right breast were performed demonstrating persistence of a small oval circumscribed mass in the central superior right breast measuring approximately 0.4 cm.  Ultrasound:  Targeted ultrasound is performed in the right breast at 12 o'clock 11 cm from the nipple demonstrating an oval circumscribed  hypoechoic mass with echogenic rim measuring 0.5 x 0.3 x 0.4 cm. This corresponds to the mammographic finding.  Targeted ultrasound the right axilla demonstrates normal lymph nodes.  IMPRESSION: Indeterminate mass in the right breast at 12 o'clock measuring 0.5 cm.  RECOMMENDATION: Ultrasound-guided core needle biopsy x1 of the right breast.  I have discussed the findings and recommendations with the patient who agrees to proceed with biopsy. The patient will be scheduled for the biopsy appointment prior to leaving the office today.  BI-RADS CATEGORY 4: Suspicious.  Pathology core needle biopsy: 09/27/2022 Breast, right, needle core biopsy, 12:00, 11 cmfn - GRANULOMATOUS INFLAMMATION  Review of Systems: A complete review of systems was obtained from the patient. I have reviewed this information and discussed as appropriate with the patient. See HPI as well for other ROS. ROS: positive for heartburn, easy bruising, headache, depression/anxiety, memory loss.  Medical History: Past Medical History: Diagnosis Date Anemia GERD (gastroesophageal reflux disease)  Patient Active Problem List Diagnosis Abnormality of right breast on screening mammogram with discordant needle biopsy  Past Surgical History: Procedure Laterality Date BIOPSY TEMPORAL ARTERY Left 10/12/2020 Dr. Donell Beers   Allergies Allergen Reactions Trazodone Other (See Comments) "made me wired" and had opposite effect Latex Rash  Current Outpatient Medications on File Prior to Visit Medication Sig Dispense Refill IRON POLYSACCHARIDES 150 mg iron capsule Take 150 mg by mouth once daily lansoprazole (PREVACID) 30 MG DR capsule TAKE ONE CAPSULE BY MOUTH daily BEFORE a meal aspirin 81 MG EC tablet Take 81 mg by mouth once daily ergocalciferol, vitamin D2, 1,250 mcg (50,000 unit) capsule Take 50,000 Units by mouth every 7 (seven) days rosuvastatin (CRESTOR) 5  MG tablet Take 5 mg by mouth once daily  No current  facility-administered medications on file prior to visit.  Family History Problem Relation Age of Onset High blood pressure (Hypertension) Mother Hyperlipidemia (Elevated cholesterol) Mother Diabetes Mother Breast cancer Mother Hyperlipidemia (Elevated cholesterol) Father High blood pressure (Hypertension) Father Lung cancer Father   Social History  Tobacco Use Smoking Status Former Types: Cigarettes Quit date: 1984 Years since quitting: 39.8 Smokeless Tobacco Never   Social History  Socioeconomic History Marital status: Married Tobacco Use Smoking status: Former Types: Cigarettes Quit date: 1984 Years since quitting: 39.8 Smokeless tobacco: Never Substance and Sexual Activity Alcohol use: Yes Comment: Socially Drug use: Never  Objective:  Vitals: BP: 128/76 Temp: 36.7 C (98.1 F) Weight: 75.7 kg (166 lb 12.8 oz) Height: 167.6 cm (5\' 6" )  Body mass index is 26.92 kg/m.  Gen: No acute distress. Well nourished and well groomed. Neurological: Alert and oriented to person, place, and time. Coordination normal. Head: Normocephalic and atraumatic. Eyes: Conjunctivae are normal. Pupils are equal, round, and reactive to light. No scleral icterus. Neck: Normal range of motion. Neck supple. No tracheal deviation or thyromegaly present. Cardiovascular: Normal rate, regular rhythm, normal heart sounds and intact distal pulses. Exam reveals no gallop and no friction rub. No murmur heard. Breast: No palpable masses. No skin dimpling. No nipple retraction. No lymphadenopathy. No asymmetry. Respiratory: Effort normal. No respiratory distress. No chest wall tenderness. Breath sounds normal. No wheezes, rales or rhonchi. GI: Soft. Bowel sounds are normal. The abdomen is soft and nontender. There is no rebound and no guarding. Musculoskeletal: Normal range of motion. Extremities are nontender. Lymphadenopathy: No cervical, preauricular, postauricular or axillary adenopathy is  present Skin: Skin is warm and dry. No rash noted. No diaphoresis. No erythema. No pallor. No clubbing, cyanosis, or edema. Psychiatric: Normal mood and affect. Behavior is normal. Judgment and thought content normal.  Labs N/a  Assessment and Plan:  ICD-10-CM 1. Abnormality of right breast on screening mammogram with discordant needle biopsy R92.8  2. Chest pain, unspecified type R07.9 Ambulatory Referral to Cardiology   Patient has a discordant biopsy following an abnormal right mammogram. We will plan to do a seed localized excisional biopsy.  I will refer her to cardiology to see if she needs any additional work-up for chest pain.  The surgical procedure was described to the patient. I discussed the incision type and location and that we will need radiology involved on with a wire or seed marker and/or sentinel node.  We discussed the risks bleeding, infection, damage to other structures, need for further procedures/surgeries. We discussed the risk of seroma. The patient was advised if the breast has cancer, we may need to go back to surgery for additional tissue to obtain negative margins or for a lymph node biopsy. The patient was advised that these are the most common complications, but that others can occur as well. I discussed the risk of alteration in breast contour or size. I discussed risk of chronic pain. There are rare instances of heart/lung issues post op as well as blood clots.  They were advised against taking aspirin or other anti-inflammatory agents/blood thinners the week before surgery.  The risks and benefits of the procedure were described to the patient and she wishes to proceed.

## 2022-12-03 NOTE — Op Note (Signed)
Right Breast Radioactive seed localized excisional biopsy  Indications: This patient presents with history of abnormal right mammogram with discordant core needle biopsy.    Pre-operative Diagnosis: abnormal right mammogram    Post-operative Diagnosis: abnormal right mammogram  Surgeon: Stark Klein   Anesthesia: General endotracheal anesthesia  ASA Class: 2  Procedure Details  The patient was seen in the Holding Room. The risks, benefits, complications, treatment options, and expected outcomes were discussed with the patient. The possibilities of bleeding, infection, the need for additional procedures, failure to diagnose a condition, and creating a complication requiring transfusion or operation were discussed with the patient. The patient concurred with the proposed plan, giving informed consent.  The site of surgery properly noted/marked. The patient was taken to Operating Room # 2, identified, and the procedure verified as Right Breast seed localized excisional biopsy. A Time Out was held and the above information confirmed.  The right breast and chest were prepped and draped in standard fashion. An axillary incision was made near the previously placed radioactive seed.  Dissection was carried medially around the point of maximum signal intensity. The cautery was used to perform the dissection.   The specimen was inked with the margin marker paint kit.    Specimen radiography confirmed inclusion of the mammographic lesion, the clip, and the seed.  The background signal in the breast was zero.  One large surgical clip was placed in the breast cavity.  Hemostasis was achieved with cautery.  The wound was irrigated and closed with 3-0 vicryl interrupted deep dermal sutures and 4-0 monocryl running subcuticular suture.      Sterile dressings were applied. At the end of the operation, all sponge, instrument, and needle counts were correct.  Findings: Seed, clip in specimen.  anterior margin is  skin, posterior margin is pectoralis.    Estimated Blood Loss:  min         Specimens: right breast tissue with seed         Complications:  None; patient tolerated the procedure well.         Disposition: PACU - hemodynamically stable.         Condition: stable

## 2022-12-03 NOTE — Discharge Instructions (Addendum)
[facility] Office Phone Number [phone]  BREAST BIOPSY/ PARTIAL MASTECTOMY: POST OP INSTRUCTIONS  Always review your discharge instruction sheet given to you by the facility where your surgery was performed.  IF YOU HAVE DISABILITY OR FAMILY LEAVE FORMS, YOU MUST BRING THEM TO THE OFFICE FOR PROCESSING.  DO NOT GIVE THEM TO YOUR DOCTOR.  Take 2 tylenol (acetominophen) three times a day for 3 days.  If you still have pain, add ibuprofen with food in between if able to take this (if you have kidney issues or stomach issues, do not take ibuprofen).  If both of those are not enough, add the narcotic pain pill.  If you find you are needing a lot of this overnight after surgery, call the next morning for a refill.    Prescriptions will not be filled after 5pm or on week-ends. Take your usually prescribed medications unless otherwise directed You should eat very light the first 24 hours after surgery, such as soup, crackers, pudding, etc.  Resume your normal diet the day after surgery. Most patients will experience some swelling and bruising in the breast.  Ice packs and a good support bra will help.  Swelling and bruising can take several days to resolve.  It is common to experience some constipation if taking pain medication after surgery.  Increasing fluid intake and taking a stool softener will usually help or prevent this problem from occurring.  A mild laxative (Milk of Magnesia or Miralax) should be taken according to package directions if there are no bowel movements after 48 hours. Unless discharge instructions indicate otherwise, you may remove your bandages 48 hours after surgery, and you may shower at that time.  You may have steri-strips (small skin tapes) in place directly over the incision.  These strips should be left on the skin at least for for 7-10 days.    ACTIVITIES:  You may resume regular daily activities (gradually increasing) beginning the next day.  Wearing a  good support bra or sports bra (or the breast binder) minimizes pain and swelling.  You may have sexual intercourse when it is comfortable. No heavy lifting for 1-2 weeks (not over around 10 pounds).  You may drive when you no longer are taking prescription pain medication, you can comfortably wear a seatbelt, and you can safely maneuver your car and apply brakes. RETURN TO WORK:  __________3-14 days depending on job. _______________ You should see your doctor in the office for a follow-up appointment approximately two weeks after your surgery.  Your doctor's nurse will typically make your follow-up appointment when she calls you with your pathology report.  Expect your pathology report 3-4 business days after your surgery.  You may call to check if you do not hear from us after three days.   WHEN TO CALL YOUR DOCTOR: Fever over 101.0 Nausea and/or vomiting. Extreme swelling or bruising. Continued bleeding from incision. Increased pain, redness, or drainage from the incision.  The clinic staff is available to answer your questions during regular business hours.  Please don't hesitate to call and ask to speak to one of the nurses for clinical concerns.  If you have a medical emergency, go to the nearest emergency room or call 911.  A surgeon from [facility] is always on call at the hospital.  For further questions, please visit [email or web address]   

## 2022-12-03 NOTE — Anesthesia Procedure Notes (Signed)
Procedure Name: LMA Insertion Date/Time: 12/03/2022 9:27 AM  Performed by: Samara Deist, CRNAPre-anesthesia Checklist: Patient identified, Emergency Drugs available, Suction available and Patient being monitored Patient Re-evaluated:Patient Re-evaluated prior to induction Oxygen Delivery Method: Circle System Utilized Preoxygenation: Pre-oxygenation with 100% oxygen Induction Type: IV induction Ventilation: Mask ventilation without difficulty LMA: LMA inserted LMA Size: 4.0 Number of attempts: 1 Airway Equipment and Method: Bite block Placement Confirmation: positive ETCO2 Tube secured with: Tape Dental Injury: Teeth and Oropharynx as per pre-operative assessment

## 2022-12-04 ENCOUNTER — Encounter (HOSPITAL_COMMUNITY): Payer: Self-pay | Admitting: General Surgery

## 2022-12-05 LAB — SURGICAL PATHOLOGY

## 2023-01-02 ENCOUNTER — Encounter (HOSPITAL_COMMUNITY): Payer: Self-pay

## 2023-01-30 ENCOUNTER — Encounter: Payer: BLUE CROSS/BLUE SHIELD | Admitting: Psychology

## 2023-03-06 ENCOUNTER — Encounter: Payer: BLUE CROSS/BLUE SHIELD | Admitting: Psychology

## 2024-01-27 ENCOUNTER — Other Ambulatory Visit: Payer: Self-pay | Admitting: Internal Medicine

## 2024-01-27 DIAGNOSIS — Z1231 Encounter for screening mammogram for malignant neoplasm of breast: Secondary | ICD-10-CM

## 2024-01-30 ENCOUNTER — Ambulatory Visit
Admission: RE | Admit: 2024-01-30 | Discharge: 2024-01-30 | Disposition: A | Discharge status: Home / Self Care | Payer: Commercial Managed Care - HMO | Source: Ambulatory Visit | Attending: Internal Medicine | Admitting: Internal Medicine

## 2024-01-30 DIAGNOSIS — Z1231 Encounter for screening mammogram for malignant neoplasm of breast: Secondary | ICD-10-CM
